# Patient Record
Sex: Male | Born: 1940 | Race: White | Hispanic: No | Marital: Single | Smoking: Former smoker
Health system: Southern US, Community
[De-identification: ages and names within clinical notes are randomized; demographics above are authoritative.]

## PROBLEM LIST (undated history)

## (undated) DIAGNOSIS — G4730 Sleep apnea, unspecified: Secondary | ICD-10-CM

## (undated) DIAGNOSIS — G8929 Other chronic pain: Secondary | ICD-10-CM

## (undated) DIAGNOSIS — E785 Hyperlipidemia, unspecified: Secondary | ICD-10-CM

## (undated) DIAGNOSIS — I10 Essential (primary) hypertension: Secondary | ICD-10-CM

## (undated) DIAGNOSIS — M545 Low back pain, unspecified: Secondary | ICD-10-CM

## (undated) DIAGNOSIS — J45909 Unspecified asthma, uncomplicated: Secondary | ICD-10-CM

## (undated) DIAGNOSIS — E119 Type 2 diabetes mellitus without complications: Secondary | ICD-10-CM

## (undated) HISTORY — PX: SHOULDER SURGERY: SHX246

## (undated) HISTORY — DX: Hyperlipidemia, unspecified: E78.5

## (undated) HISTORY — DX: Low back pain: M54.5

## (undated) HISTORY — DX: Sleep apnea, unspecified: G47.30

## (undated) HISTORY — DX: Type 2 diabetes mellitus without complications: E11.9

## (undated) HISTORY — PX: KNEE SURGERY: SHX244

## (undated) HISTORY — PX: BACK SURGERY: SHX140

## (undated) HISTORY — DX: Unspecified asthma, uncomplicated: J45.909

## (undated) HISTORY — DX: Other chronic pain: G89.29

## (undated) HISTORY — DX: Essential (primary) hypertension: I10

## (undated) HISTORY — DX: Low back pain, unspecified: M54.50

---

## 1997-12-30 ENCOUNTER — Encounter: Payer: Self-pay | Admitting: Emergency Medicine

## 1997-12-30 ENCOUNTER — Emergency Department (HOSPITAL_COMMUNITY): Admission: EM | Admit: 1997-12-30 | Discharge: 1997-12-30 | Payer: Self-pay | Admitting: Emergency Medicine

## 1998-01-05 ENCOUNTER — Emergency Department (HOSPITAL_COMMUNITY): Admission: EM | Admit: 1998-01-05 | Discharge: 1998-01-05 | Payer: Self-pay | Admitting: Emergency Medicine

## 1998-01-05 ENCOUNTER — Encounter: Payer: Self-pay | Admitting: Emergency Medicine

## 1998-01-20 ENCOUNTER — Emergency Department (HOSPITAL_COMMUNITY): Admission: EM | Admit: 1998-01-20 | Discharge: 1998-01-20 | Payer: Self-pay | Admitting: Emergency Medicine

## 2000-07-11 ENCOUNTER — Emergency Department (HOSPITAL_COMMUNITY): Admission: EM | Admit: 2000-07-11 | Discharge: 2000-07-11 | Payer: Self-pay | Admitting: Emergency Medicine

## 2003-04-19 ENCOUNTER — Ambulatory Visit (HOSPITAL_COMMUNITY): Admission: RE | Admit: 2003-04-19 | Discharge: 2003-04-19 | Payer: Self-pay | Admitting: Orthopedic Surgery

## 2003-04-23 ENCOUNTER — Inpatient Hospital Stay (HOSPITAL_COMMUNITY): Admission: RE | Admit: 2003-04-23 | Discharge: 2003-04-24 | Payer: Self-pay | Admitting: Orthopedic Surgery

## 2003-12-17 ENCOUNTER — Emergency Department (HOSPITAL_COMMUNITY): Admission: EM | Admit: 2003-12-17 | Discharge: 2003-12-18 | Payer: Self-pay | Admitting: Emergency Medicine

## 2004-02-05 ENCOUNTER — Ambulatory Visit: Payer: Self-pay | Admitting: Family Medicine

## 2004-02-17 ENCOUNTER — Encounter: Admission: RE | Admit: 2004-02-17 | Discharge: 2004-02-17 | Payer: Self-pay | Admitting: Family Medicine

## 2004-03-12 ENCOUNTER — Ambulatory Visit: Payer: Self-pay | Admitting: Family Medicine

## 2004-05-28 ENCOUNTER — Emergency Department (HOSPITAL_COMMUNITY): Admission: EM | Admit: 2004-05-28 | Discharge: 2004-05-28 | Payer: Self-pay | Admitting: Emergency Medicine

## 2004-06-02 ENCOUNTER — Ambulatory Visit (HOSPITAL_COMMUNITY): Admission: RE | Admit: 2004-06-02 | Discharge: 2004-06-02 | Payer: Self-pay | Admitting: General Surgery

## 2004-08-21 ENCOUNTER — Ambulatory Visit: Payer: Self-pay | Admitting: Internal Medicine

## 2004-09-17 ENCOUNTER — Ambulatory Visit (HOSPITAL_BASED_OUTPATIENT_CLINIC_OR_DEPARTMENT_OTHER): Admission: RE | Admit: 2004-09-17 | Discharge: 2004-09-17 | Payer: Self-pay | Admitting: Internal Medicine

## 2004-09-27 ENCOUNTER — Ambulatory Visit: Payer: Self-pay | Admitting: Internal Medicine

## 2004-10-15 ENCOUNTER — Ambulatory Visit: Payer: Self-pay | Admitting: Internal Medicine

## 2004-11-06 ENCOUNTER — Ambulatory Visit: Payer: Self-pay | Admitting: Internal Medicine

## 2004-12-08 ENCOUNTER — Ambulatory Visit: Payer: Self-pay | Admitting: Internal Medicine

## 2004-12-22 ENCOUNTER — Ambulatory Visit (HOSPITAL_COMMUNITY): Admission: RE | Admit: 2004-12-22 | Discharge: 2004-12-22 | Payer: Self-pay | Admitting: Internal Medicine

## 2005-09-07 ENCOUNTER — Encounter: Admission: RE | Admit: 2005-09-07 | Discharge: 2005-09-07 | Payer: Self-pay | Admitting: Orthopedic Surgery

## 2005-09-28 ENCOUNTER — Encounter: Admission: RE | Admit: 2005-09-28 | Discharge: 2005-09-28 | Payer: Self-pay | Admitting: Orthopedic Surgery

## 2006-02-15 ENCOUNTER — Encounter: Admission: RE | Admit: 2006-02-15 | Discharge: 2006-02-15 | Payer: Self-pay | Admitting: Rheumatology

## 2006-12-07 DIAGNOSIS — E119 Type 2 diabetes mellitus without complications: Secondary | ICD-10-CM | POA: Insufficient documentation

## 2006-12-07 DIAGNOSIS — N400 Enlarged prostate without lower urinary tract symptoms: Secondary | ICD-10-CM

## 2007-11-15 ENCOUNTER — Ambulatory Visit: Payer: Self-pay | Admitting: Gastroenterology

## 2007-11-29 ENCOUNTER — Ambulatory Visit: Payer: Self-pay | Admitting: Gastroenterology

## 2007-11-29 HISTORY — PX: COLONOSCOPY: SHX174

## 2009-06-30 ENCOUNTER — Emergency Department (HOSPITAL_COMMUNITY): Admission: EM | Admit: 2009-06-30 | Discharge: 2009-06-30 | Payer: Self-pay | Admitting: Emergency Medicine

## 2010-05-17 ENCOUNTER — Inpatient Hospital Stay (INDEPENDENT_AMBULATORY_CARE_PROVIDER_SITE_OTHER)
Admission: RE | Admit: 2010-05-17 | Discharge: 2010-05-17 | Disposition: A | Discharge status: Home / Self Care | Payer: Medicare Other | Source: Ambulatory Visit | Attending: Emergency Medicine | Admitting: Emergency Medicine

## 2010-05-17 ENCOUNTER — Ambulatory Visit (INDEPENDENT_AMBULATORY_CARE_PROVIDER_SITE_OTHER): Payer: Medicare Other

## 2010-05-17 DIAGNOSIS — J42 Unspecified chronic bronchitis: Secondary | ICD-10-CM

## 2010-05-23 ENCOUNTER — Inpatient Hospital Stay (INDEPENDENT_AMBULATORY_CARE_PROVIDER_SITE_OTHER)
Admission: RE | Admit: 2010-05-23 | Discharge: 2010-05-23 | Disposition: A | Discharge status: Home / Self Care | Payer: Medicare Other | Source: Ambulatory Visit | Attending: Family Medicine | Admitting: Family Medicine

## 2010-05-23 DIAGNOSIS — J309 Allergic rhinitis, unspecified: Secondary | ICD-10-CM

## 2010-06-02 LAB — POCT I-STAT, CHEM 8
BUN: 12 mg/dL (ref 6–23)
Calcium, Ion: 1.23 mmol/L (ref 1.12–1.32)
Chloride: 101 mEq/L (ref 96–112)
Creatinine, Ser: 0.7 mg/dL (ref 0.4–1.5)
Glucose, Bld: 103 mg/dL — ABNORMAL HIGH (ref 70–99)
HCT: 44 % (ref 39.0–52.0)
Hemoglobin: 15 g/dL (ref 13.0–17.0)
Potassium: 4.3 mEq/L (ref 3.5–5.1)
Sodium: 138 mEq/L (ref 135–145)
TCO2: 30 mmol/L (ref 0–100)

## 2010-06-30 ENCOUNTER — Encounter: Payer: Self-pay | Admitting: Internal Medicine

## 2010-07-02 ENCOUNTER — Telehealth: Payer: Self-pay | Admitting: Internal Medicine

## 2010-07-02 ENCOUNTER — Ambulatory Visit (INDEPENDENT_AMBULATORY_CARE_PROVIDER_SITE_OTHER): Payer: Medicare Other | Admitting: Internal Medicine

## 2010-07-02 ENCOUNTER — Encounter: Payer: Self-pay | Admitting: Internal Medicine

## 2010-07-02 VITALS — BP 128/74 | HR 77 | Ht 73.0 in | Wt 243.8 lb

## 2010-07-02 DIAGNOSIS — G4733 Obstructive sleep apnea (adult) (pediatric): Secondary | ICD-10-CM

## 2010-07-02 DIAGNOSIS — J310 Chronic rhinitis: Secondary | ICD-10-CM

## 2010-07-02 DIAGNOSIS — J40 Bronchitis, not specified as acute or chronic: Secondary | ICD-10-CM

## 2010-07-02 MED ORDER — AMOXICILLIN-POT CLAVULANATE 875-125 MG PO TABS: 1.0000 | ORAL_TABLET | Freq: Two times a day (BID) | ORAL | Status: AC

## 2010-07-02 MED ORDER — OLOPATADINE HCL 0.2 % OP SOLN: 1.0000 [drp] | Freq: Every day | OPHTHALMIC | Status: DC

## 2010-07-02 NOTE — Patient Instructions (Addendum)
Neb alb   Depo 80  Script sent for augmentin antibiotic   We will schedule return for allergy skin testing. You will need to be off all antihistamines, cold and allergy medicines, over the counter sleep medicines and cough syrups for 3 days before the skin tests.

## 2010-07-02 NOTE — Telephone Encounter (Signed)
Spoke with pharmacist and he states that since the original msg, they received the rx from [place] by fax and nothing further is needed.

## 2010-07-02 NOTE — Progress Notes (Signed)
  Subjective:    Patient ID: Cole Morgan, male    DOB: 10/30/1940, 71 y.o.   MRN: [id number]  HPI 70 yo former smoker with hx of allergic rhinitis, skin test positive. Hx obstructive sleep apnea (AHI 15.4/hr)  intolerant of CPAP. Hx of multiple traumas, mostly from horse-riding injuries. I last saw him 2006. He wants reassessment of his allergy status. Blames allergy for making him sleepy. Describes bronchitis each winter, which he attributes to allergy rather than colds- with productive cough, head and chest congestion. Went to [facility] twice this winter with nasal and chest congestion treated with antibiotics, prednisone taper, nose spray. He has been much better in the past month. Today he notes productive cough, white phlegm. He expects to treat this as an acute event, after which he expects to stay well through the summer.Denies fever, purulent mucus, blood, nodes, sneeze.   Review of Systems See HPI Constitutional:   No weight loss, night sweats,  Fevers, chills, fatigue, lassitude. HEENT:   No headaches,  Difficulty swallowing,  Tooth/dental problems,  Sore throat,   CV:  No chest pain,  Orthopnea, PND, swelling in lower extremities, anasarca, dizziness, palpitations  GI  No heartburn, indigestion, abdominal pain, nausea, vomiting, diarrhea, change in bowel habits, loss of appetite  Resp: No shortness of breath with exertion or at rest.  No excess mucus, no productive cough,  No non-productive cough,  No coughing up of blood.  No change in color of mucus.  No wheezing.   Skin: no rash or lesions.  GU: no dysuria, change in color of urine, no urgency or frequency.  No flank pain.  MS:  Chronic shoulder pain and stiffness with decreased range of motion.  Psych:  No change in mood or affect. No depression or anxiety.  No memory loss.      Objective:   Physical Exam General- Alert, Oriented, Affect-appropriate, Distress- none acute     Tall/ big man  Skin- rash-none, lesions-  none, excoriation- none  Lymphadenopathy- none  Head- atraumatic  Eyes- Gross vision intact, PERRLA, conjunctivae clear, secretions  Ears- Normal- Hearing, canals, Tm L ,   R ,  Nose- Clear, o- Septal dev, mucus, polyps, erosion, perforation   Throat- Mallampati II , mucosa clear , drainage- none, tonsils- atrophic  Neck- flexible , trachea midline, no stridor , thyroid nl, carotid no bruit  Chest - symmetrical excursion , unlabored     Heart/CV- RRR , no murmur , no gallop  , no rub, nl s1 s2                     - JVD- none , edema- none, stasis changes- none, varices- none     Lung- clear to P&A, wheeze- none, cough- none , dullness-none, rub- none     Chest wall- Abd- tender-no, distended-no, bowel sounds-present, HSM- no  Br/ Gen/ Rectal- Not done, not indicated  Extrem- cyanosis- none, clubbing, none, atrophy- none, strength- nl. Moves upper arms stiffly  Neuro- grossly intact to observation         Assessment & Plan:

## 2010-07-05 ENCOUNTER — Encounter: Payer: Self-pay | Admitting: Internal Medicine

## 2010-07-05 DIAGNOSIS — J302 Other seasonal allergic rhinitis: Secondary | ICD-10-CM | POA: Insufficient documentation

## 2010-07-05 DIAGNOSIS — G4733 Obstructive sleep apnea (adult) (pediatric): Secondary | ICD-10-CM | POA: Insufficient documentation

## 2010-07-05 NOTE — Assessment & Plan Note (Addendum)
Perennial rhinitis probably with some seasonal allergic rhinitis and very likly some mechanical narrowing from old trauma. During the winter, he gets episodes of URI w/ bronchitis, although he tends to regard them as allergy. He specifically wants to reassess the allergy aspect.

## 2010-07-07 ENCOUNTER — Encounter: Payer: Self-pay | Admitting: Internal Medicine

## 2010-07-08 ENCOUNTER — Encounter: Payer: Self-pay | Admitting: Internal Medicine

## 2010-07-31 NOTE — Procedures (Signed)
Cole Morgan, Cole Morgan              ACCOUNT NO.:  [id number]   MEDICAL RECORD NO.:  [id number]          PATIENT TYPE:  OUT   LOCATION:  SLEEP CENTER                 FACILITY:  [facility]   PHYSICIAN:  Clinton D. Maple Hudson, M.D. DATE OF BIRTH:  April 13, 1940   DATE OF STUDY:  09/17/2004                              NOCTURNAL POLYSOMNOGRAM   REFERRING PHYSICIAN:  Jetty Duhamel, MD   INDICATION FOR STUDY:  Hypersomnia with sleep apnea. Epworth sleep score:  24. BMI 31. Weight 238 pounds.   SLEEP ARCHITECTURE:  Total sleep time 393 minutes with sleep efficiency 81%.  Stage I 10%, stage II 49%, stage III and IV 10%, REM 11% of total sleep  time. Sleep latency 3.5 minutes. REM latency 101 minutes. Awake after sleep  onset 92 minutes. Arousal index 32. No bed time medication reported.   RESPIRATORY DATA:  Respiratory disturbance index (RDI/AHI) 15.4 obstructive  events per hour indicating moderate obstructive sleep apnea/hypopnea  syndrome. There were 22 obstructive apneas, 1 mixed apnea, and 70 hypopneas.  Events were not positional but were rather more frequent while supine. The  technician could not fit him with a comfortable mask. He complained of  marked nasal congestion but could not sleep with a full face mask on this  study night without medication and he complained of his face itching. This  study was completed as a diagnostic NPSG.   OXYGEN DATA:  Moderate snoring with oxygen desaturation with a nadir of 75%.  Mean oxygen saturation through the study was 95% on room air.   CARDIAC DATA:  Normal sinus rhythm with occasional PVC.   MOVEMENT/PARASOMNIA:  A total of 187 limb jerks were recorded of which 54  were associated with arousal or awakening for a periodic limb movement with  arousal index of 8.2 per hour which is increased.   IMPRESSION:  1.  Moderate obstructive sleep apnea/hypopnea syndrome, respiratory      disturbance index 15.4 per hour with moderate snoring and oxygen  desaturation of 75%.  2.  Consider return for C-PAP titration with a sedative suggested. May also      benefit from correction with nasal      congestion.  3.  Periodic limb movement with arousal, 8.2 per hour.      Clinton D. Maple Hudson, M.D.  Diplomat    CDY/MEDQ  D:  09/27/2004 13:12:15  T:  09/28/2004 00:14:54  Job:  [id number]

## 2010-07-31 NOTE — Op Note (Signed)
NAME:  Cole Morgan, Cole Morgan                        ACCOUNT NO.:  [id number]   MEDICAL RECORD NO.:  [id number]                   PATIENT TYPE:  OIB   LOCATION:  6702                                 FACILITY:  [facility]   PHYSICIAN:  Burnard Bunting, M.D.                 DATE OF BIRTH:  [birth year]   DATE OF PROCEDURE:  04/22/2003  DATE OF DISCHARGE:                                 OPERATIVE REPORT   PREOPERATIVE DIAGNOSIS:  Right shoulder impingement syndrome and rotator  cuff tear.   POSTOPERATIVE DIAGNOSIS:  Right shoulder impingement syndrome and rotator  cuff tear.   OPERATION PERFORMED:  Right shoulder open rotator cuff repair, subacromial  decompression, debridement.   SURGEON:  Jerolyn Shin. Tresa Res, M.D.   ASSISTANT:  Burnard Bunting, M.D.   ANESTHESIA:  General endotracheal plus interscalene block.   ESTIMATED BLOOD LOSS:  50 mL.   DRAINS:  None.   OPERATIVE FINDINGS:  1. Examination under anesthesia, range of motion external rotation 15     degrees, abduction 70.  External rotation 90 degrees abduction 90,     internal rotation 90 degrees abduction, 60.  Forward flexion 180.     Shoulder stability 1+ anterior posterior less than 1 cm .  2. Tear of the infraspinatus and supraspinatus with minimal retraction but     the large full thickness tear was present with significant bursitis     noted.   DESCRIPTION OF PROCEDURE:  The patient was brought to the operating room  where general endotracheal anesthesia was induced.  Preoperative intravenous  antibiotics were administered.  The right shoulder was prepped with DuraPrep  solution and draped with DuraPrep solution and draped in sterile manner.  The prior incision was utilized. This strap incision was incised from  posterior to anterior.  Skin and subcutaneous tissue were sharply divided.  Full thickness skin flaps were developed on the superior and inferior  portion of the incision.  The deltoid was identified.  The deltoid was  split  down a measured distance of 4 cm with stay suture placed at the inferior  aspect of the deltoid split.  The deltoid was retracted with self-retaining  Arthrex retractor.  The bursa was removed.  Thick bursa was present.  Subacromial decompression was then performed with a rasp.  The rotator cuff  tear was identified.  It involved almost all of the infraspinatus and  supraspinatus.  Using a headlight and the arthroscope placed through the  incision, the biceps tendon was found to be intact.  Debridement was then  performed.  The rotator cuff tear was mobilized.  Its edges were sharply  debrided back to healthy  Viable tissue.  __________  bursitis was removed  from the rotator cuff itself.  At this time, five #2 FiberWire sutures were  placed in modified ONEOK fashion through the rotator cuff.  CurvTek  drill was then used to drill  a bone tunnels through the humeral head.  The  drill holes were placed in such manner that reproduced the more anterior to  posterior __________  of the minimally retracted supraspinatus and  infraspinatus tendon.  Once suture anchor was placed at the posterior aspect  of the rotator cuff tendon repair.  Using a combination of the suture, a  second suture anchor was placed at the anterior aspect of the rotator cuff  tendon tear.  Using combination of suture anchors and bone tunnels, the  large rotator cuff tear was repaired back down in watertight fashion.  The  arm was taken through a range of motion and found to have excellent motion  with minimal tension on the repair. At this time the incision was thoroughly  irrigated. The deltoid split was closed using a #1 Vicryl suture.  Skin was  closed using interrupted inverted 2-0 Vicryl suture and simple nylon suture  The patient tolerated the procedure well without immediate complications.                                               Burnard Bunting, M.D.    GSD/MEDQ  D:  04/23/2003  T:  04/23/2003   Job:  [id number]

## 2010-08-19 ENCOUNTER — Ambulatory Visit: Payer: Medicare Other | Admitting: Internal Medicine

## 2010-09-18 ENCOUNTER — Other Ambulatory Visit: Payer: Self-pay | Admitting: Sports Medicine

## 2010-09-18 DIAGNOSIS — M25511 Pain in right shoulder: Secondary | ICD-10-CM

## 2010-09-22 ENCOUNTER — Ambulatory Visit
Admission: RE | Admit: 2010-09-22 | Discharge: 2010-09-22 | Disposition: A | Discharge status: Home / Self Care | Payer: Medicare Other | Source: Ambulatory Visit | Attending: Sports Medicine | Admitting: Sports Medicine

## 2010-09-22 DIAGNOSIS — M25511 Pain in right shoulder: Secondary | ICD-10-CM

## 2010-09-23 ENCOUNTER — Other Ambulatory Visit: Payer: Self-pay | Admitting: Sports Medicine

## 2010-09-23 ENCOUNTER — Ambulatory Visit: Payer: Medicare Other | Admitting: Internal Medicine

## 2010-09-23 DIAGNOSIS — M25511 Pain in right shoulder: Secondary | ICD-10-CM

## 2010-09-28 ENCOUNTER — Ambulatory Visit
Admission: RE | Admit: 2010-09-28 | Discharge: 2010-09-28 | Disposition: A | Discharge status: Home / Self Care | Payer: Medicare Other | Source: Ambulatory Visit | Attending: Sports Medicine | Admitting: Sports Medicine

## 2010-09-28 DIAGNOSIS — M25511 Pain in right shoulder: Secondary | ICD-10-CM

## 2010-12-02 ENCOUNTER — Inpatient Hospital Stay (INDEPENDENT_AMBULATORY_CARE_PROVIDER_SITE_OTHER)
Admission: RE | Admit: 2010-12-02 | Discharge: 2010-12-02 | Disposition: A | Discharge status: Home / Self Care | Payer: Medicare Other | Source: Ambulatory Visit | Attending: Family Medicine | Admitting: Family Medicine

## 2010-12-02 DIAGNOSIS — S91309A Unspecified open wound, unspecified foot, initial encounter: Secondary | ICD-10-CM

## 2010-12-14 LAB — GLUCOSE, CAPILLARY
Glucose-Capillary: 103 — ABNORMAL HIGH
Glucose-Capillary: 113 — ABNORMAL HIGH

## 2011-01-29 ENCOUNTER — Telehealth: Payer: Self-pay | Admitting: Family Medicine

## 2011-01-29 NOTE — Telephone Encounter (Signed)
Spoke to Dr. Clent Ridges, and he agreed to see the patient as new pt.  He should not start seeing him with an acute visit.  Pt. Notified and agrees. He will go to Urgent Care if he becomes sick before he schedules the appt here.

## 2011-01-29 NOTE — Telephone Encounter (Signed)
Pt has not seen doc fry since 2008. Pt is on medicare having cough and sob. Pt is aware doc is not accepting new medicare patients. Pt is requesting ov today. This call was triaged by deb

## 2011-01-29 NOTE — Telephone Encounter (Signed)
Triage was asked to talk to this pt to see if his symptoms sound emergent.  Pt states he has been bothered with some transient SOB for several years.  After a discussion with Pt, he admits he really does not have a family doctor right now, but his girlfriend sees Dr. Clent Ridges.  When he came with her to see Dr. Clent Ridges, he spoke to the front office about establishing as a new pt.  He claims that he was told to fill out some papers, and Dr. Clent Ridges could see him when he needed to be seen.  Referred the call back upfront to speak to the office staff as I do not schedule new pts, and am not aware of the guidelines with each MD's protocol and Medicare rules.

## 2011-02-01 NOTE — Telephone Encounter (Signed)
Pt is sch for 03-31-2011 130pm. Pt will callback to see if any cancellations.

## 2011-02-03 ENCOUNTER — Emergency Department (INDEPENDENT_AMBULATORY_CARE_PROVIDER_SITE_OTHER): Payer: Medicare Other

## 2011-02-03 ENCOUNTER — Encounter (HOSPITAL_COMMUNITY): Payer: Self-pay | Admitting: *Deleted

## 2011-02-03 ENCOUNTER — Emergency Department (INDEPENDENT_AMBULATORY_CARE_PROVIDER_SITE_OTHER)
Admission: EM | Admit: 2011-02-03 | Discharge: 2011-02-03 | Disposition: A | Discharge status: Home / Self Care | Payer: Medicare Other | Source: Home / Self Care | Attending: Emergency Medicine | Admitting: Emergency Medicine

## 2011-02-03 DIAGNOSIS — R059 Cough, unspecified: Secondary | ICD-10-CM

## 2011-02-03 DIAGNOSIS — R05 Cough: Secondary | ICD-10-CM

## 2011-02-03 MED ORDER — HYDROCOD POLST-CHLORPHEN POLST 10-8 MG/5ML PO LQCR: 5.0000 mL | Freq: Two times a day (BID) | ORAL | Status: DC | PRN

## 2011-02-03 NOTE — ED Provider Notes (Signed)
History     CSN: [id number] Arrival date & time: 02/03/2011  6:28 PM   First MD Initiated Contact with Patient 02/03/11 1809      Chief Complaint  Patient presents with  . Cough    (Consider location/radiation/quality/duration/timing/severity/associated sxs/prior treatment) HPI Comments: For about 6 weeks been coughing its worse at night and during the winter" actually it also tends to happen in Feb" " i never went back to get the allergy testing that I wanted and discussed with Dr.Young". No fevers, at times do feel like i cant breath well".  Patient is a 70 y.o. male presenting with cough. The history is provided by the patient.  Cough This is a recurrent problem. Episode onset: 6 weeks. The problem occurs constantly. The cough is non-productive. There has been no fever. Associated symptoms include shortness of breath. Pertinent negatives include no weight loss, no sore throat and no wheezing. He has tried nothing for the symptoms.    Past Medical History  Diagnosis Date  . Type II or unspecified type diabetes mellitus without mention of complication, not stated as uncontrolled   . Sleep apnea   . ALLERGIC RHINITIS     Past Surgical History  Procedure Date  . Back surgery   . Knee surgery   . Shoulder surgery     multiple sugeries, injured riding horses    Family History  Problem Relation Age of Onset  . Coronary artery disease    . Diabetes    . Asthma    . Allergies      History  Substance Use Topics  . Smoking status: Former Smoker -- 1.0 packs/day for 15 years    Types: Cigarettes    Quit date: 03/15/1985  . Smokeless tobacco: Not on file  . Alcohol Use: No      Review of Systems  Constitutional: Negative for weight loss.  HENT: Negative for sore throat.   Respiratory: Positive for cough and shortness of breath. Negative for wheezing.     Allergies  Review of patient's allergies indicates no known allergies.  Home Medications   Current  Outpatient Rx  Name Route Sig Dispense Refill  . AMOXICILLIN-POT CLAVULANATE 875-125 MG PO TABS Oral Take 1 tablet by mouth 2 (two) times daily.      Marland Kitchen PREDNISONE 10 MG PO TABS Oral Take 10 mg by mouth daily.      . FOSINOPRIL SODIUM 10 MG PO TABS Oral Take 10 mg by mouth daily.      Marland Kitchen GLIMEPIRIDE 1 MG PO TABS Oral Take 1 mg by mouth daily before breakfast.      . OLOPATADINE HCL 0.2 % OP SOLN Ophthalmic Apply 1 drop to eye daily. 1 Bottle 5  . PIOGLITAZONE HCL-METFORMIN HCL 15-850 MG PO TABS Oral Take 1 tablet by mouth daily.      Marland Kitchen PRAVASTATIN SODIUM 40 MG PO TABS Oral Take 40 mg by mouth daily.      Marland Kitchen SITAGLIPTIN PHOSPHATE 100 MG PO TABS Oral Take 100 mg by mouth daily.        BP 127/81  Pulse 80  Temp(Src) 98.1 F (36.7 C) (Oral)  Resp 20  SpO2 94%  Physical Exam  ED Course  Procedures (including critical care time)  Labs Reviewed - No data to display No results found.   No diagnosis found.    MDM  Cough for 6 weeks and dyspnea- with PCP and previous care by Pulmonologist  Jimmie Molly, MD 02/03/11 2012

## 2011-02-03 NOTE — ED Notes (Signed)
Pt   Has  Cough  Shortness   Of  Breath       X     6  Weeks       Worse  At  Night        The  Cough   Is  Worse  At  Night       The  Mucous    Is  Thick

## 2011-02-09 ENCOUNTER — Ambulatory Visit (INDEPENDENT_AMBULATORY_CARE_PROVIDER_SITE_OTHER): Payer: Medicare Other | Admitting: Internal Medicine

## 2011-02-09 ENCOUNTER — Encounter: Payer: Self-pay | Admitting: Internal Medicine

## 2011-02-09 DIAGNOSIS — J209 Acute bronchitis, unspecified: Secondary | ICD-10-CM

## 2011-02-09 DIAGNOSIS — J310 Chronic rhinitis: Secondary | ICD-10-CM

## 2011-02-09 NOTE — Progress Notes (Signed)
Patient ID: Cole Morgan, male    DOB: Sep 23, 1940, 70 y.o.   MRN: [id number]  HPI 70 yo former smoker with hx of allergic rhinitis, skin test positive. Hx obstructive sleep apnea (AHI 15.4/hr)  intolerant of CPAP. Hx of multiple traumas, mostly from horse-riding injuries. I last saw him 2006. He wants reassessment of his allergy status. Blames allergy for making him sleepy. Describes bronchitis each winter, which he attributes to allergy rather than colds- with productive cough, head and chest congestion. Went to [employer] [facility] twice this winter with nasal and chest congestion treated with antibiotics, prednisone taper, nose spray. He has been much better in the past month. Today he notes productive cough, white phlegm. He expects to treat this as an acute event, after which he expects to stay well through the summer. Denies fever, purulent mucus, blood, nodes, sneeze.  02/09/11- 41 yo former smoker with hx of allergic rhinitis, skin test positive. Hx obstructive sleep apnea (AHI 15.4/hr)  intolerant of CPAP. Hx of multiple traumas, mostly from horse-riding injuries. He has had flu vaccine and shingles vaccine. He says because of a death in the family, he forgot he had scheduled allergy skin testing here. Now for the past 8 weeks he has had a persistent bronchitis with cough. He went to an urgent care last week for hard cough with thick sticky mucus. There has been no blood or purulent discharge, fever or adenopathy. They gave him an antibiotic, prednisone taper, Mucinex DM cough syrup. He says each year when he first starts at home, he begins having more of this trouble. We discussed dryness, seasonal colds, house dust. Chest x-ray had not shown acute process. Metered inhaler has been no help. He has a home nebulizer machine but has not been using it.  Review of Systems See HPI Constitutional:   No-   weight loss, night sweats, fevers, chills, fatigue, lassitude. HEENT:   No-  headaches, difficulty  swallowing, tooth/dental problems, sore throat,       No-  sneezing, itching, ear ache, nasal congestion, post nasal drip,  CV:  No-   chest pain, orthopnea, PND, swelling in lower extremities, anasarca,                                  dizziness, palpitations Resp: No-   shortness of breath with exertion or at rest.              + productive cough,  No non-productive cough,  No- coughing up of blood.              No-   change in color of mucus.  No- wheezing.   Skin: No-   rash or lesions. GI:  No-   heartburn, indigestion, abdominal pain, nausea, vomiting, diarrhea,                 change in bowel habits, loss of appetite GU: No-   dysuria, change in color of urine, no urgency or frequency.  No- flank pain. MS:  No-   joint pain or swelling.  No- decreased range of motion.  No- back pain. Neuro-     nothing unusual Psych:  No- change in mood or affect. No depression or anxiety.  No memory loss.  Objective:   Physical Exam General- Alert, Oriented, Affect-appropriate, Distress- none acute Skin- rash-none, lesions- none, excoriation- none Lymphadenopathy- none Head- atraumatic  Eyes- Gross vision intact, PERRLA, conjunctivae clear secretions            Ears- Hearing, canals-normal            Nose- old nasal fracture with septal deviation,, mucus, No-polyps, erosion, perforation             Throat- Mallampati II , mucosa clear , drainage- none, tonsils- atrophic Neck- flexible , trachea midline, no stridor , thyroid nl, carotid no bruit Chest - symmetrical excursion , unlabored           Heart/CV- RRR , no murmur , no gallop  , no rub, nl s1 s2                           - JVD- none , edema- none, stasis changes- none, varices- none           Lung- scattered rhonchi, wheeze- none, +dry cough , dullness-none, rub- none           Chest wall-  Abd- tender-no, distended-no, bowel sounds-present, HSM- no Br/ Gen/ Rectal- Not done, not indicated Extrem- cyanosis- none, clubbing,  none, atrophy- none, strength- nl Neuro- grossly intact to observation

## 2011-02-09 NOTE — Patient Instructions (Signed)
Sample Qvar 80 inhaler-  2 puffs and rinse mouth well, twice daily. Use up the sample.  Depo 80    This will raise your blood sugar  Use your nebulizer up to 4 times daily as needed- it may begin to loosen your chest better  Schedule allergy skin testing- antihistamines block the skin tests, so for 3 days before the test, don't use any antihistamines, otc cough, cold or allergy meds, or your cough syrup, or otc sleep meds.

## 2011-02-10 DIAGNOSIS — J209 Acute bronchitis, unspecified: Secondary | ICD-10-CM | POA: Insufficient documentation

## 2011-02-10 NOTE — Assessment & Plan Note (Addendum)
He has not cleared adequately with antibiotics prednisone and Mucinex D. We discussed effect of steroids on his diabetes. Plan steroid inhaler, use home nebulizer up to 4 times a day if needed, Depo-Medrol.

## 2011-02-10 NOTE — Assessment & Plan Note (Signed)
He still suspects there is a seasonal allergy component to his rhinitis and bronchitis. He wants allergy skin testing done. We discussed this again.

## 2011-02-22 ENCOUNTER — Ambulatory Visit: Payer: Medicare Other | Admitting: Internal Medicine

## 2011-02-22 ENCOUNTER — Encounter: Payer: Self-pay | Admitting: Gastroenterology

## 2011-03-17 ENCOUNTER — Ambulatory Visit (INDEPENDENT_AMBULATORY_CARE_PROVIDER_SITE_OTHER): Payer: Medicare Other | Admitting: Internal Medicine

## 2011-03-17 ENCOUNTER — Encounter: Payer: Self-pay | Admitting: Internal Medicine

## 2011-03-17 VITALS — BP 122/68 | HR 75 | Ht 73.0 in | Wt 237.0 lb

## 2011-03-17 DIAGNOSIS — J3089 Other allergic rhinitis: Secondary | ICD-10-CM

## 2011-03-17 DIAGNOSIS — J209 Acute bronchitis, unspecified: Secondary | ICD-10-CM

## 2011-03-17 NOTE — Patient Instructions (Addendum)
I will have the allergy lab call you when your allergy vaccine is ready to start.  Ok to take your regular medicines as needed    Sample Pataday allergy eye drops-- 1 or 2 drops each eye up to twice daily as needed  Neb neo nasal  Depo 80

## 2011-03-17 NOTE — Progress Notes (Signed)
Patient ID: Cole Morgan, male    DOB: 25-Dec-1940, 71 y.o.   MRN: [id number]  HPI 71 yo former smoker with hx of allergic rhinitis, skin test positive. Hx obstructive sleep apnea (AHI 15.4/hr)  intolerant of CPAP. Hx of multiple traumas, mostly from horse-riding injuries. I last saw him 2006. He wants reassessment of his allergy status. Blames allergy for making him sleepy. Describes bronchitis each winter, which he attributes to allergy rather than colds- with productive cough, head and chest congestion. Went to [facility] ER twice this winter with nasal and chest congestion treated with antibiotics, prednisone taper, nose spray. He has been much better in the past month. Today he notes productive cough, white phlegm. He expects to treat this as an acute event, after which he expects to stay well through the summer. Denies fever, purulent mucus, blood, nodes, sneeze.  03/17/11- 71 yo former smoker with hx of allergic rhinitis, allergic conjunctivitis, skin test positive, recurrent bronchitis. Hx obstructive sleep apnea (AHI 15.4/hr)  intolerant of CPAP. Hx of multiple traumas, mostly from horse-riding injuries. He wants reassessment of his allergy status. Complains of burning itch in throat when he tries to sleep, burning, itching and watering of eyes and nose on waking in AM. Says he has tried multiple otc allergy meds and eye drops w/o benefit.  Allergy Testing-especially positive for house dust but also for numerous grass, weed and tree pollens.  Review of Systems See HPI Constitutional:   No-   weight loss, night sweats, fevers, chills, fatigue, lassitude. HEENT:   No-  headaches, difficulty swallowing, tooth/dental problems, sore throat,       +  sneezing, itching, ear ache, nasal congestion, post nasal drip,  CV:  No-   chest pain, orthopnea, PND, swelling in lower extremities, anasarca, dizziness, palpitations Resp: No- acute  shortness of breath with exertion or at rest.              No-    productive cough,  No non-productive cough,  No- coughing up of blood.              No-   change in color of mucus.  No- wheezing.   Skin: No-   rash or lesions. GI:  No-   heartburn, indigestion, abdominal pain, nausea, vomiting, diarrhea,                 change in bowel habits, loss of appetite GU: MS:  No-   joint pain or swelling.  No- decreased range of motion.  No- back pain. Neuro-     nothing unusual Psych:  No- change in mood or affect. No depression or anxiety.  No memory loss.   Objective:   Physical Exam General- Alert, Oriented, Affect-appropriate, Distress- none acute Skin- rash-none, lesions- none, excoriation- none Lymphadenopathy- none Head- atraumatic            Eyes- Gross vision intact, PERRLA, conjunctivae red            Ears- Hearing, canals-normal            Nose- turbinate edema. He was blowing his nose repeatedly, no-Septal dev, mucus, polyps, erosion, perforation             Throat- Mallampati II , mucosa clear , drainage- none, tonsils- atrophic Neck- flexible , trachea midline, no stridor , thyroid nl, carotid no bruit Chest - symmetrical excursion , unlabored           Heart/CV- RRR , no  murmur , no gallop  , no rub, nl s1 s2                           - JVD- none , edema- none, stasis changes- none, varices- none           Lung- clear to P&A, wheeze- none, cough- none , dullness-none, rub- none           Chest wall- surgical scars Abd-  Br/ Gen/ Rectal- Not done, not indicated Extrem- cyanosis- none, clubbing, none, atrophy- none, strength- nl Neuro- grossly intact to observation

## 2011-03-17 NOTE — Assessment & Plan Note (Signed)
He has gone as far as he thinks he can with oral antihistamines and various kinds of nasal sprays. We discussed his purchase of an air cleaner which has made only a little help, and other measures he has attempted for dust control. We discussed allergy vaccine, technique, goals, potentially significant risks including anaphylaxis and potential lack of benefit. He wishes to start allergy shots.

## 2011-03-18 ENCOUNTER — Ambulatory Visit (INDEPENDENT_AMBULATORY_CARE_PROVIDER_SITE_OTHER): Payer: Medicare Other

## 2011-03-18 DIAGNOSIS — J309 Allergic rhinitis, unspecified: Secondary | ICD-10-CM

## 2011-03-19 ENCOUNTER — Telehealth: Payer: Self-pay | Admitting: *Deleted

## 2011-03-19 NOTE — Telephone Encounter (Signed)
Called pt.Marland Kitchen He is going to start his vac. 03-22-2011. Pt. lives in [place], is there an office closer to him that will be willing to give him his allergy shots. I'm asking because I have a feeling he's going to ask after a time or two of driving over here.Please advise.

## 2011-03-22 ENCOUNTER — Telehealth: Payer: Self-pay | Admitting: Internal Medicine

## 2011-03-22 ENCOUNTER — Ambulatory Visit (INDEPENDENT_AMBULATORY_CARE_PROVIDER_SITE_OTHER): Payer: Medicare Other

## 2011-03-22 DIAGNOSIS — J309 Allergic rhinitis, unspecified: Secondary | ICD-10-CM

## 2011-03-22 NOTE — Telephone Encounter (Signed)
Sample at front for pick up-Left message that sample is ready.

## 2011-03-22 NOTE — Telephone Encounter (Signed)
Ok to leave sample Patanase nasal spray  1-2 puffs each nostril up to twice daily as needed

## 2011-03-22 NOTE — Telephone Encounter (Signed)
I think his primary doctor is Dr Clent Ridges. That office, and most offices, wil not give allergy shots. If he is going to get shots he will need to come here.

## 2011-03-22 NOTE — Telephone Encounter (Signed)
Pt was not in lobby when checked, also asked Krista L and she advised pt is not there. [email or web address] x 1

## 2011-03-22 NOTE — Telephone Encounter (Signed)
Pt would like a sample of a nasal spray to help with congestion, something he can use as needed-pls advise and if ok he would like sample left out front to p/u on Thursday when he comes for his allergy injection.

## 2011-03-25 ENCOUNTER — Ambulatory Visit (INDEPENDENT_AMBULATORY_CARE_PROVIDER_SITE_OTHER): Payer: Medicare Other

## 2011-03-25 DIAGNOSIS — J309 Allergic rhinitis, unspecified: Secondary | ICD-10-CM

## 2011-03-29 ENCOUNTER — Ambulatory Visit (INDEPENDENT_AMBULATORY_CARE_PROVIDER_SITE_OTHER): Payer: Medicare Other

## 2011-03-29 ENCOUNTER — Encounter: Payer: Self-pay | Admitting: Internal Medicine

## 2011-03-29 ENCOUNTER — Telehealth: Payer: Self-pay | Admitting: Internal Medicine

## 2011-03-29 DIAGNOSIS — J309 Allergic rhinitis, unspecified: Secondary | ICD-10-CM

## 2011-03-29 MED ORDER — PROMETHAZINE-CODEINE 6.25-10 MG/5ML PO SYRP: 5.0000 mL | ORAL_SOLUTION | Freq: Four times a day (QID) | ORAL | Status: AC | PRN

## 2011-03-29 NOTE — Telephone Encounter (Signed)
Per CDY, give pt sample of Qvar , 2 puffs BID and call in phenergan w/ codeine, 200 mL, 1 tsp every 6 hours PRN cough. Pt has sample and will call to let CDY know if this helps. Will also call if his cough does not improve or if sxs get worse. Cough med called to [facility] per pt request.

## 2011-03-29 NOTE — Telephone Encounter (Signed)
Patient in lobby.  Patient asking for inhaler sample or rx for cough.

## 2011-03-29 NOTE — Telephone Encounter (Signed)
Pt c/o thick white mucus and says he coughed all night and cannot sleep. He is requesting something be called in for the cough. Pt on his way to the office for his allergy injection and will check on the msg when he arrives. Pls advise.No Known Allergies

## 2011-03-31 ENCOUNTER — Encounter: Payer: Self-pay | Admitting: Family Medicine

## 2011-03-31 ENCOUNTER — Ambulatory Visit (INDEPENDENT_AMBULATORY_CARE_PROVIDER_SITE_OTHER): Payer: Medicare Other | Admitting: Family Medicine

## 2011-03-31 VITALS — BP 110/60 | HR 73 | Temp 98.1°F | Ht 73.5 in | Wt 230.0 lb

## 2011-03-31 DIAGNOSIS — I10 Essential (primary) hypertension: Secondary | ICD-10-CM

## 2011-03-31 DIAGNOSIS — E785 Hyperlipidemia, unspecified: Secondary | ICD-10-CM

## 2011-03-31 DIAGNOSIS — N401 Enlarged prostate with lower urinary tract symptoms: Secondary | ICD-10-CM

## 2011-03-31 DIAGNOSIS — Z136 Encounter for screening for cardiovascular disorders: Secondary | ICD-10-CM

## 2011-03-31 DIAGNOSIS — N138 Other obstructive and reflux uropathy: Secondary | ICD-10-CM

## 2011-03-31 DIAGNOSIS — K635 Polyp of colon: Secondary | ICD-10-CM

## 2011-03-31 DIAGNOSIS — E119 Type 2 diabetes mellitus without complications: Secondary | ICD-10-CM

## 2011-03-31 MED ORDER — HYDROCOD POLST-CHLORPHEN POLST 10-8 MG/5ML PO LQCR
5.0000 mL | Freq: Two times a day (BID) | ORAL | Status: DC | PRN
Start: 2011-03-31 — End: 2011-05-17

## 2011-03-31 NOTE — Progress Notes (Signed)
  Subjective:    Patient ID: Cole Morgan, male    DOB: 11/04/1940, 71 y.o.   MRN: [id number]  HPI 71 yr old male to re-establish with me after an absence of about 4 years. He has been seeing Dr. Lucianne Muss regularly to manage his diabetes, lipids, and HTN. He sees Dr. Maple Hudson regularly for his allergies. He feels well in general except for frequent dry coughing. He recently started on allergy shots, and we hope these will give him some relief. No chest pain or SOB. He stays quite active caring for and riding his horses.    Review of Systems  Constitutional: Negative.   HENT: Negative.   Eyes: Negative.   Respiratory: Negative.   Cardiovascular: Negative.   Gastrointestinal: Negative.   Genitourinary: Negative.   Musculoskeletal: Negative.   Skin: Negative.   Neurological: Negative.   Hematological: Negative.   Psychiatric/Behavioral: Negative.        Objective:   Physical Exam  Constitutional: He is oriented to person, place, and time. He appears well-developed and well-nourished. No distress.  HENT:  Head: Normocephalic and atraumatic.  Right Ear: External ear normal.  Left Ear: External ear normal.  Nose: Nose normal.  Mouth/Throat: Oropharynx is clear and moist. No oropharyngeal exudate.  Eyes: Conjunctivae and EOM are normal. Pupils are equal, round, and reactive to light. Right eye exhibits no discharge. Left eye exhibits no discharge. No scleral icterus.  Neck: Neck supple. No JVD present. No tracheal deviation present. No thyromegaly present.  Cardiovascular: Normal rate, regular rhythm, normal heart sounds and intact distal pulses.  Exam reveals no gallop and no friction rub.   No murmur heard.      EKG normal with a single PVC  Pulmonary/Chest: Effort normal and breath sounds normal. No respiratory distress. He has no wheezes. He has no rales. He exhibits no tenderness.  Abdominal: Soft. Bowel sounds are normal. He exhibits no distension and no mass. There is no  tenderness. There is no rebound and no guarding.  Genitourinary: Rectum normal, prostate normal and penis normal. Guaiac negative stool. No penile tenderness.  Musculoskeletal: Normal range of motion. He exhibits no edema and no tenderness.  Lymphadenopathy:    He has no cervical adenopathy.  Neurological: He is alert and oriented to person, place, and time. He has normal reflexes. No cranial nerve deficit. He exhibits normal muscle tone. Coordination normal.  Skin: Skin is warm and dry. No rash noted. He is not diaphoretic. No erythema. No pallor.  Psychiatric: He has a normal mood and affect. His behavior is normal. Judgment and thought content normal.          Assessment & Plan:  Well exam. Get a PSA today. He gets all other labs per Dr. Lucianne Muss. Set up for another colonoscopy.

## 2011-04-01 ENCOUNTER — Ambulatory Visit (INDEPENDENT_AMBULATORY_CARE_PROVIDER_SITE_OTHER): Payer: Medicare Other

## 2011-04-01 DIAGNOSIS — J309 Allergic rhinitis, unspecified: Secondary | ICD-10-CM

## 2011-04-01 LAB — PSA: PSA: 6.47 ng/mL — ABNORMAL HIGH (ref 0.10–4.00)

## 2011-04-05 ENCOUNTER — Ambulatory Visit (INDEPENDENT_AMBULATORY_CARE_PROVIDER_SITE_OTHER): Payer: Medicare Other

## 2011-04-05 DIAGNOSIS — J309 Allergic rhinitis, unspecified: Secondary | ICD-10-CM

## 2011-04-08 ENCOUNTER — Ambulatory Visit (INDEPENDENT_AMBULATORY_CARE_PROVIDER_SITE_OTHER): Payer: Medicare Other

## 2011-04-08 DIAGNOSIS — J309 Allergic rhinitis, unspecified: Secondary | ICD-10-CM

## 2011-04-12 ENCOUNTER — Ambulatory Visit (INDEPENDENT_AMBULATORY_CARE_PROVIDER_SITE_OTHER): Payer: Medicare Other

## 2011-04-12 DIAGNOSIS — J309 Allergic rhinitis, unspecified: Secondary | ICD-10-CM

## 2011-04-15 ENCOUNTER — Ambulatory Visit (INDEPENDENT_AMBULATORY_CARE_PROVIDER_SITE_OTHER): Payer: Medicare Other

## 2011-04-15 DIAGNOSIS — J309 Allergic rhinitis, unspecified: Secondary | ICD-10-CM

## 2011-04-16 ENCOUNTER — Ambulatory Visit (INDEPENDENT_AMBULATORY_CARE_PROVIDER_SITE_OTHER): Payer: Medicare Other

## 2011-04-16 DIAGNOSIS — J309 Allergic rhinitis, unspecified: Secondary | ICD-10-CM

## 2011-04-19 ENCOUNTER — Ambulatory Visit (INDEPENDENT_AMBULATORY_CARE_PROVIDER_SITE_OTHER): Payer: Medicare Other

## 2011-04-19 DIAGNOSIS — J309 Allergic rhinitis, unspecified: Secondary | ICD-10-CM

## 2011-04-22 ENCOUNTER — Encounter: Payer: Self-pay | Admitting: Family Medicine

## 2011-04-22 ENCOUNTER — Ambulatory Visit (INDEPENDENT_AMBULATORY_CARE_PROVIDER_SITE_OTHER)
Admission: RE | Admit: 2011-04-22 | Discharge: 2011-04-22 | Disposition: A | Discharge status: Home / Self Care | Payer: Medicare Other | Source: Ambulatory Visit | Attending: Family Medicine | Admitting: Family Medicine

## 2011-04-22 ENCOUNTER — Ambulatory Visit (INDEPENDENT_AMBULATORY_CARE_PROVIDER_SITE_OTHER): Payer: Medicare Other

## 2011-04-22 ENCOUNTER — Telehealth: Payer: Self-pay | Admitting: Internal Medicine

## 2011-04-22 ENCOUNTER — Ambulatory Visit (INDEPENDENT_AMBULATORY_CARE_PROVIDER_SITE_OTHER): Payer: Medicare Other | Admitting: Family Medicine

## 2011-04-22 VITALS — BP 110/62 | Temp 97.5°F | Wt 233.0 lb

## 2011-04-22 DIAGNOSIS — J309 Allergic rhinitis, unspecified: Secondary | ICD-10-CM

## 2011-04-22 DIAGNOSIS — R0781 Pleurodynia: Secondary | ICD-10-CM

## 2011-04-22 DIAGNOSIS — R079 Chest pain, unspecified: Secondary | ICD-10-CM

## 2011-04-22 LAB — POCT URINALYSIS DIPSTICK
Glucose, UA: NEGATIVE
Ketones, UA: NEGATIVE
Leukocytes, UA: NEGATIVE
pH, UA: 5

## 2011-04-22 MED ORDER — OXYCODONE-ACETAMINOPHEN 5-325 MG PO TABS
1.0000 | ORAL_TABLET | Freq: Three times a day (TID) | ORAL | Status: AC | PRN
Start: 2011-04-22 — End: 2011-05-02

## 2011-04-22 NOTE — Telephone Encounter (Signed)
Patient was offered to be seen by [facility] but would have a wait time of approx 1 hour-pt left and is being seen by PCP Dr Caryl Never.

## 2011-04-22 NOTE — Progress Notes (Signed)
  Subjective:    Patient ID: Cole Morgan, male    DOB: 1940/04/05, 71 y.o.   MRN: [id number]  HPI  Acute visit. 3 days ago onset of right anterior rib cage pain. Denies specific injury. He does recall leaning against a truck but no specific injury. No ecchymosis. No rashes. He has a cough. Does have some pain with deep breathing but no pleuritic pain. No change in bowel habits. No appetite or weight changes. Pain is severe at times and interfering with sleep. No relief with over-the-counter medications. Felt similar with rib fracture several years ago.  He has chronic problems of type 2 diabetes and obstructive sleep apnea. Ex-smoker. Had physical last month PSA over 6.   Review of Systems  Constitutional: Negative for fever and chills.  Respiratory: Negative for cough, shortness of breath and wheezing.   Cardiovascular: Negative for chest pain, palpitations and leg swelling.  Gastrointestinal: Negative for abdominal pain.  Genitourinary: Negative for dysuria and hematuria.  Hematological: Negative for adenopathy. Does not bruise/bleed easily.       Objective:   Physical Exam  Constitutional: He is oriented to person, place, and time. He appears well-developed and well-nourished.  Cardiovascular: Normal rate and regular rhythm.   Pulmonary/Chest: Effort normal and breath sounds normal. No respiratory distress. He has no wheezes. He has no rales.  Abdominal: Soft. He exhibits no distension and no mass. There is no tenderness. There is no rebound and no guarding.  Musculoskeletal:       Patient is extremely tender right anterior rib cage around 12 rib. No masses palpated  Neurological: He is alert and oriented to person, place, and time.  Skin: No rash noted.  Psychiatric: He has a normal mood and affect.          Assessment & Plan:  Acute onset of right anterior rib cage/flank pain. Ace wrap provided. Percocet 5/325 mg 1-2 every 6 hours as a for severe pain. Obtain films  with ribs and chest x-ray. Urine dip negative. Schedule followup with Dr. Clent Ridges in one week Recent elevated PSA.  He will discuss with Dr Clent Ridges at f/u in one week.

## 2011-04-26 ENCOUNTER — Ambulatory Visit (INDEPENDENT_AMBULATORY_CARE_PROVIDER_SITE_OTHER): Payer: Medicare Other

## 2011-04-26 DIAGNOSIS — J309 Allergic rhinitis, unspecified: Secondary | ICD-10-CM

## 2011-04-29 ENCOUNTER — Encounter: Payer: Self-pay | Admitting: Family Medicine

## 2011-04-29 ENCOUNTER — Ambulatory Visit (INDEPENDENT_AMBULATORY_CARE_PROVIDER_SITE_OTHER): Payer: Medicare Other

## 2011-04-29 ENCOUNTER — Ambulatory Visit (INDEPENDENT_AMBULATORY_CARE_PROVIDER_SITE_OTHER): Payer: Medicare Other | Admitting: Family Medicine

## 2011-04-29 VITALS — BP 112/70 | HR 75 | Temp 98.6°F | Wt 236.0 lb

## 2011-04-29 DIAGNOSIS — J309 Allergic rhinitis, unspecified: Secondary | ICD-10-CM

## 2011-04-29 DIAGNOSIS — R079 Chest pain, unspecified: Secondary | ICD-10-CM

## 2011-04-29 DIAGNOSIS — R0781 Pleurodynia: Secondary | ICD-10-CM

## 2011-04-29 DIAGNOSIS — R972 Elevated prostate specific antigen [PSA]: Secondary | ICD-10-CM

## 2011-04-29 NOTE — Progress Notes (Signed)
  Subjective:    Patient ID: Cole Morgan, male    DOB: June 22, 1940, 71 y.o.   MRN: [id number]  HPI Here to follow up right lower chest pains and a recent elevated PSA. He had a PSA drawn here on 03-30-12 that was 6.47. The last one he had drawn was at least 5 years ago, and we do not have that result to compare with. He has also been dealing with sharp pains in the right lower chest for several weeks. He thinks it may have started after he worked several hours on his truck and leaned over the engine. He had a CXR and rib Xrays recently that were normal. The pain is slowly improving. No SOB or cough.    Review of Systems  Constitutional: Negative.   Respiratory: Negative.   Cardiovascular: Positive for chest pain. Negative for palpitations and leg swelling.       Objective:   Physical Exam  Constitutional: He appears well-developed and well-nourished.  Pulmonary/Chest: Effort normal and breath sounds normal. No respiratory distress. He has no wheezes. He has no rales.       Tender in the right anterior lower ribs along the anterior axillary line, no crepitus or masses          Assessment & Plan:  Rib pain, probably due to a costochondral separation. This should resolve over the next few weeks. As for the elevated PSA, we will refer him to Urology.

## 2011-05-03 ENCOUNTER — Ambulatory Visit (INDEPENDENT_AMBULATORY_CARE_PROVIDER_SITE_OTHER): Payer: Medicare Other

## 2011-05-03 DIAGNOSIS — J309 Allergic rhinitis, unspecified: Secondary | ICD-10-CM

## 2011-05-06 ENCOUNTER — Ambulatory Visit (INDEPENDENT_AMBULATORY_CARE_PROVIDER_SITE_OTHER): Payer: Medicare Other

## 2011-05-06 DIAGNOSIS — J309 Allergic rhinitis, unspecified: Secondary | ICD-10-CM

## 2011-05-10 ENCOUNTER — Ambulatory Visit (INDEPENDENT_AMBULATORY_CARE_PROVIDER_SITE_OTHER): Payer: Medicare Other

## 2011-05-10 DIAGNOSIS — J309 Allergic rhinitis, unspecified: Secondary | ICD-10-CM

## 2011-05-12 ENCOUNTER — Encounter: Payer: Self-pay | Admitting: Internal Medicine

## 2011-05-13 ENCOUNTER — Ambulatory Visit (INDEPENDENT_AMBULATORY_CARE_PROVIDER_SITE_OTHER): Payer: Medicare Other

## 2011-05-13 DIAGNOSIS — J309 Allergic rhinitis, unspecified: Secondary | ICD-10-CM

## 2011-05-14 ENCOUNTER — Ambulatory Visit (INDEPENDENT_AMBULATORY_CARE_PROVIDER_SITE_OTHER): Payer: Medicare Other

## 2011-05-14 DIAGNOSIS — J309 Allergic rhinitis, unspecified: Secondary | ICD-10-CM

## 2011-05-17 ENCOUNTER — Ambulatory Visit (INDEPENDENT_AMBULATORY_CARE_PROVIDER_SITE_OTHER): Payer: Medicare Other

## 2011-05-17 ENCOUNTER — Ambulatory Visit (INDEPENDENT_AMBULATORY_CARE_PROVIDER_SITE_OTHER): Payer: Medicare Other | Admitting: Internal Medicine

## 2011-05-17 ENCOUNTER — Encounter: Payer: Self-pay | Admitting: Internal Medicine

## 2011-05-17 VITALS — BP 110/74 | HR 78 | Ht 73.0 in | Wt 238.2 lb

## 2011-05-17 DIAGNOSIS — J309 Allergic rhinitis, unspecified: Secondary | ICD-10-CM

## 2011-05-17 DIAGNOSIS — J209 Acute bronchitis, unspecified: Secondary | ICD-10-CM

## 2011-05-17 DIAGNOSIS — J3089 Other allergic rhinitis: Secondary | ICD-10-CM

## 2011-05-17 MED ORDER — MOMETASONE FUROATE 50 MCG/ACT NA SUSP: 2.0000 | Freq: Every day | NASAL | Status: DC

## 2011-05-17 MED ORDER — OLOPATADINE HCL 0.2 % OP SOLN: 1.0000 [drp] | OPHTHALMIC | Status: AC

## 2011-05-17 MED ORDER — MONTELUKAST SODIUM 10 MG PO TABS: 10.0000 mg | ORAL_TABLET | Freq: Every day | ORAL | Status: DC

## 2011-05-17 MED ORDER — METHYLPREDNISOLONE ACETATE 80 MG/ML IJ SUSP
80.0000 mg | Freq: Once | INTRAMUSCULAR | Status: AC
  Administered 2011-05-17: 80 mg via INTRAMUSCULAR

## 2011-05-17 MED ORDER — PHENYLEPHRINE HCL 1 % NA SOLN
3.0000 [drp] | Freq: Four times a day (QID) | NASAL | Status: DC | PRN
  Administered 2011-05-17: 3 [drp] via NASAL

## 2011-05-17 MED ORDER — HYDROCOD POLST-CHLORPHEN POLST 10-8 MG/5ML PO LQCR: 5.0000 mL | Freq: Two times a day (BID) | ORAL | Status: DC | PRN

## 2011-05-17 NOTE — Progress Notes (Signed)
Patient ID: Cole Morgan, male    DOB: 1940-04-14, 71 y.o.   MRN: [id number]  HPI 71 yo former smoker with hx of allergic rhinitis, skin test positive. Hx obstructive sleep apnea (AHI 15.4/hr)  intolerant of CPAP. Hx of multiple traumas, mostly from horse-riding injuries. I last saw him 2006. He wants reassessment of his allergy status. Blames allergy for making him sleepy. Describes bronchitis each winter, which he attributes to allergy rather than colds- with productive cough, head and chest congestion. Went to [facility] ER twice this winter with nasal and chest congestion treated with antibiotics, prednisone taper, nose spray. He has been much better in the past month. Today he notes productive cough, white phlegm. He expects to treat this as an acute event, after which he expects to stay well through the summer. Denies fever, purulent mucus, blood, nodes, sneeze.  03/17/11- 72 yo former smoker with hx of allergic rhinitis, allergic conjunctivitis, skin test positive, recurrent bronchitis. Hx obstructive sleep apnea (AHI 15.4/hr)  intolerant of CPAP. Hx of multiple traumas, mostly from horse-riding injuries. He wants reassessment of his allergy status. Complains of burning itch in throat when he tries to sleep, burning, itching and watering of eyes and nose on waking in AM. Says he has tried multiple otc allergy meds and eye drops w/o benefit.  Allergy Testing-especially positive for house dust but also for numerous grass, weed and tree pollens.  05/17/11- 72 yo former smoker with hx of allergic rhinitis, allergic conjunctivitis, skin test positive, recurrent bronchitis. Hx obstructive sleep apnea (AHI 15.4/hr)  intolerant of CPAP. Hx of multiple traumas, mostly from horse-riding injuries. Increased chest congestion and irritation of eyes and nose with itching and nasal drainage mostly in the last 5 days. He has been outdoors a lot. Much cough but not wheezing. He is building allergy vaccine without  problems.   Review of Systems-See HPI Constitutional:   No-   weight loss, night sweats, fevers, chills, fatigue, lassitude. HEENT:   No-  headaches, difficulty swallowing, tooth/dental problems, sore throat,       +  sneezing, itching, ear ache, nasal congestion, post nasal drip,  CV:  No-   chest pain, orthopnea, PND, swelling in lower extremities, anasarca, dizziness, palpitations Resp: No- acute  shortness of breath with exertion or at rest.              No-   productive cough,  + non-productive cough,  No- coughing up of blood.              No-   change in color of mucus.  No- wheezing.   Skin: No-   rash or lesions. GI:  No-   heartburn, indigestion, abdominal pain, nausea, vomiting, diarrhea,                 change in bowel habits, loss of appetite GU: MS:  No-   joint pain or swelling.  Neuro-     nothing unusual Psych:  No- change in mood or affect. No depression or anxiety.  No memory loss.   Objective:   Physical Exam General- Alert, Oriented, Affect-appropriate, Distress- none acute Skin- rash-none, lesions- none, excoriation- none. Very dry skin w/ some eczema in ears. Lymphadenopathy- none Head- atraumatic            Eyes- Gross vision intact, PERRLA, conjunctivae red            Ears- canals red, excoriated, question fluid L>R  Nose- turbinate edema.  no-Septal dev, mucus, polyps, erosion, perforation             Throat- Mallampati II , mucosa clear , drainage- none, tonsils- atrophic Neck- flexible , trachea midline, no stridor , thyroid nl, carotid no bruit Chest - symmetrical excursion , unlabored           Heart/CV- RRR , no murmur , no gallop  , no rub, nl s1 s2                           - JVD- none , edema- none, stasis changes- none, varices- none           Lung- clear to P&A, wheeze- none, cough- none , dullness-none, rub- none           Chest wall- surgical scars Abd-  Br/ Gen/ Rectal- Not done, not indicated Extrem- cyanosis- none, clubbing,  none, atrophy- none, strength- nl Neuro- grossly intact to observation

## 2011-05-17 NOTE — Patient Instructions (Signed)
Continue allergy vaccine build-up  Neo neb nasal  Depo 80  Refill scripts for meds, including new trial of Singulair/ montelukast- 1 daily

## 2011-05-20 ENCOUNTER — Ambulatory Visit (INDEPENDENT_AMBULATORY_CARE_PROVIDER_SITE_OTHER): Payer: Medicare Other

## 2011-05-20 DIAGNOSIS — J309 Allergic rhinitis, unspecified: Secondary | ICD-10-CM

## 2011-05-21 ENCOUNTER — Encounter: Payer: Self-pay | Admitting: Internal Medicine

## 2011-05-21 NOTE — Assessment & Plan Note (Signed)
Plan-Depo-Medrol, Tussionex, Singulair.

## 2011-05-21 NOTE — Assessment & Plan Note (Signed)
Seasonal worsening of allergic conjunctivitis and rhinitis. He is too early in his allergy vaccine buildup for major control yet. Plan-sample Pataday, Nasonex, Singulair. Nasal nebulizer, Depo-Medrol.

## 2011-05-24 ENCOUNTER — Ambulatory Visit (INDEPENDENT_AMBULATORY_CARE_PROVIDER_SITE_OTHER): Payer: Medicare Other

## 2011-05-24 DIAGNOSIS — J309 Allergic rhinitis, unspecified: Secondary | ICD-10-CM

## 2011-05-27 ENCOUNTER — Ambulatory Visit (INDEPENDENT_AMBULATORY_CARE_PROVIDER_SITE_OTHER): Payer: Medicare Other

## 2011-05-27 DIAGNOSIS — J309 Allergic rhinitis, unspecified: Secondary | ICD-10-CM

## 2011-05-31 ENCOUNTER — Ambulatory Visit (INDEPENDENT_AMBULATORY_CARE_PROVIDER_SITE_OTHER): Payer: Medicare Other

## 2011-05-31 DIAGNOSIS — J309 Allergic rhinitis, unspecified: Secondary | ICD-10-CM

## 2011-06-03 ENCOUNTER — Ambulatory Visit (INDEPENDENT_AMBULATORY_CARE_PROVIDER_SITE_OTHER): Payer: Medicare Other

## 2011-06-03 DIAGNOSIS — J309 Allergic rhinitis, unspecified: Secondary | ICD-10-CM

## 2011-06-07 ENCOUNTER — Ambulatory Visit (INDEPENDENT_AMBULATORY_CARE_PROVIDER_SITE_OTHER): Payer: Medicare Other

## 2011-06-07 DIAGNOSIS — J309 Allergic rhinitis, unspecified: Secondary | ICD-10-CM

## 2011-06-10 ENCOUNTER — Ambulatory Visit (INDEPENDENT_AMBULATORY_CARE_PROVIDER_SITE_OTHER): Payer: Medicare Other

## 2011-06-10 DIAGNOSIS — J309 Allergic rhinitis, unspecified: Secondary | ICD-10-CM

## 2011-06-15 ENCOUNTER — Ambulatory Visit (INDEPENDENT_AMBULATORY_CARE_PROVIDER_SITE_OTHER): Payer: Medicare Other

## 2011-06-15 DIAGNOSIS — J309 Allergic rhinitis, unspecified: Secondary | ICD-10-CM

## 2011-06-21 ENCOUNTER — Ambulatory Visit (INDEPENDENT_AMBULATORY_CARE_PROVIDER_SITE_OTHER): Payer: Medicare Other

## 2011-06-21 DIAGNOSIS — J309 Allergic rhinitis, unspecified: Secondary | ICD-10-CM

## 2011-06-28 ENCOUNTER — Ambulatory Visit (INDEPENDENT_AMBULATORY_CARE_PROVIDER_SITE_OTHER): Payer: Medicare Other

## 2011-06-28 DIAGNOSIS — J309 Allergic rhinitis, unspecified: Secondary | ICD-10-CM

## 2011-07-02 ENCOUNTER — Encounter: Payer: Self-pay | Admitting: Internal Medicine

## 2011-07-05 ENCOUNTER — Ambulatory Visit (INDEPENDENT_AMBULATORY_CARE_PROVIDER_SITE_OTHER): Payer: Medicare Other

## 2011-07-05 DIAGNOSIS — J309 Allergic rhinitis, unspecified: Secondary | ICD-10-CM

## 2011-07-12 ENCOUNTER — Ambulatory Visit (INDEPENDENT_AMBULATORY_CARE_PROVIDER_SITE_OTHER): Payer: Medicare Other

## 2011-07-12 DIAGNOSIS — J309 Allergic rhinitis, unspecified: Secondary | ICD-10-CM

## 2011-07-19 ENCOUNTER — Ambulatory Visit (INDEPENDENT_AMBULATORY_CARE_PROVIDER_SITE_OTHER): Payer: Medicare Other

## 2011-07-19 DIAGNOSIS — J309 Allergic rhinitis, unspecified: Secondary | ICD-10-CM

## 2011-07-26 ENCOUNTER — Ambulatory Visit (INDEPENDENT_AMBULATORY_CARE_PROVIDER_SITE_OTHER): Payer: Medicare Other

## 2011-07-26 DIAGNOSIS — J309 Allergic rhinitis, unspecified: Secondary | ICD-10-CM

## 2011-08-02 ENCOUNTER — Ambulatory Visit (INDEPENDENT_AMBULATORY_CARE_PROVIDER_SITE_OTHER): Payer: Medicare Other

## 2011-08-02 DIAGNOSIS — J309 Allergic rhinitis, unspecified: Secondary | ICD-10-CM

## 2011-08-11 ENCOUNTER — Ambulatory Visit (INDEPENDENT_AMBULATORY_CARE_PROVIDER_SITE_OTHER): Payer: Medicare Other

## 2011-08-11 DIAGNOSIS — J309 Allergic rhinitis, unspecified: Secondary | ICD-10-CM

## 2011-08-17 ENCOUNTER — Ambulatory Visit (INDEPENDENT_AMBULATORY_CARE_PROVIDER_SITE_OTHER): Payer: Medicare Other

## 2011-08-17 ENCOUNTER — Encounter: Payer: Self-pay | Admitting: Internal Medicine

## 2011-08-17 ENCOUNTER — Ambulatory Visit (INDEPENDENT_AMBULATORY_CARE_PROVIDER_SITE_OTHER): Payer: Medicare Other | Admitting: Internal Medicine

## 2011-08-17 VITALS — BP 144/66 | HR 87 | Ht 73.0 in | Wt 234.6 lb

## 2011-08-17 DIAGNOSIS — J309 Allergic rhinitis, unspecified: Secondary | ICD-10-CM

## 2011-08-17 DIAGNOSIS — J3089 Other allergic rhinitis: Secondary | ICD-10-CM

## 2011-08-17 NOTE — Patient Instructions (Addendum)
I will have the allergy lab make a vaccine with molds that we can build up to merge with your current vaccine. Then we can raise the total strength.   If you haven't tried- Allaway otc eye drops.

## 2011-08-17 NOTE — Progress Notes (Signed)
Patient ID: Cole Morgan, male    DOB: Aug 02, 1940, 71 y.o.   MRN: [id number]  HPI 71 yo former smoker with hx of allergic rhinitis, skin test positive. Hx obstructive sleep apnea (AHI 15.4/hr)  intolerant of CPAP. Hx of multiple traumas, mostly from horse-riding injuries. I last saw him 2006. He wants reassessment of his allergy status. Blames allergy for making him sleepy. Describes bronchitis each winter, which he attributes to allergy rather than colds- with productive cough, head and chest congestion. Went to [facility] ER twice this winter with nasal and chest congestion treated with antibiotics, prednisone taper, nose spray. He has been much better in the past month. Today he notes productive cough, white phlegm. He expects to treat this as an acute event, after which he expects to stay well through the summer. Denies fever, purulent mucus, blood, nodes, sneeze.  03/17/11- 20 yo former smoker with hx of allergic rhinitis, allergic conjunctivitis, skin test positive, recurrent bronchitis. Hx obstructive sleep apnea (AHI 15.4/hr)  intolerant of CPAP. Hx of multiple traumas, mostly from horse-riding injuries. He wants reassessment of his allergy status. Complains of burning itch in throat when he tries to sleep, burning, itching and watering of eyes and nose on waking in AM. Says he has tried multiple otc allergy meds and eye drops w/o benefit.  Allergy Testing-especially positive for house dust but also for numerous grass, weed and tree pollens.  05/17/11- 10 yo former smoker with hx of allergic rhinitis, allergic conjunctivitis, skin test positive, recurrent bronchitis. Hx obstructive sleep apnea (AHI 15.4/hr)  intolerant of CPAP. Hx of multiple traumas, mostly from horse-riding injuries. Increased chest congestion and irritation of eyes and nose with itching and nasal drainage mostly in the last 5 days. He has been outdoors a lot. Much cough but not wheezing. He is building allergy vaccine without  problems.  08/17/11- 43 yo former smoker with hx of allergic rhinitis, allergic conjunctivitis, skin test positive, recurrent bronchitis. Hx obstructive sleep apnea (AHI 15.4/hr)  intolerant of CPAP. Hx of multiple traumas, mostly from horse-riding injuries. Still on vaccine; unsure if allergy injections are really helping. 10 out of 12 months still has increased troubles. 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 We reviewed his skin test results from January. His throat itches some days when he goes into the barn. He did not react to horse but he reacted strongly to grass and less strongly to a number of molds. 10 months of the year he says his eyes itch. He has a history of allergic conjunctivitis when exposed to colds. Somatic pains associated with all of his many orthopedic injuries keep him awake at night.  Review of Systems-See HPI Constitutional:   No-   weight loss, night sweats, fevers, chills, fatigue, lassitude. HEENT:   No-  headaches, difficulty swallowing, tooth/dental problems, sore throat,       +  sneezing, itching, ear ache, nasal congestion, post nasal drip,  CV:  No-   chest pain, orthopnea, PND, swelling in lower extremities, anasarca, dizziness, palpitations Resp: No- acute  shortness of breath with exertion or at rest.              No-   productive cough,  + non-productive cough,  No- coughing up of blood.              No-   change in color of mucus.  No- wheezing.   Skin: No-   rash or lesions. GI:  No-  heartburn, indigestion, abdominal pain, nausea, vomiting,  GU: MS:  +   joint pain or swelling.  Neuro-     nothing unusual Psych:  No- change in mood or affect. No depression or anxiety.  No memory loss.   Objective:   Physical Exam General- Alert, Oriented, Affect-appropriate, Distress- none acute Skin- rash-none, lesions- none, excoriation- none. Very dry skin w/ some eczema in ears. Lymphadenopathy- none Head- atraumatic            Eyes- Gross  vision intact, PERRLA, conjunctivae red            Ears- canals red, excoriated, question fluid L>R            Nose- turbinate edema.  no-Septal dev, mucus, polyps, erosion, perforation             Throat- Mallampati II , mucosa clear , drainage- none, tonsils- atrophic Neck- flexible , trachea midline, no stridor , thyroid nl, carotid no bruit Chest - symmetrical excursion , unlabored           Heart/CV- RRR , no murmur , no gallop  , no rub, nl s1 s2                           - JVD- none , edema- none, stasis changes- none, varices- none           Lung- clear to P&A, wheeze- none, cough- none , dullness-none, rub- none           Chest wall- surgical scars Abd-  Br/ Gen/ Rectal- Not done, not indicated Extrem- cyanosis- none, clubbing, none, atrophy- none, strength- nl Neuro- grossly intact to observation

## 2011-08-22 NOTE — Assessment & Plan Note (Addendum)
We reviewed his skin test pattern and his history. I think the triggers he is associating with the barn are related to hay, straw and the mold. Plan-we can build the third bile of allergy vaccine to include molds. Suggested otc allergy eye drop brands to try, since Pataday and equivalents are so expensive.

## 2011-08-26 ENCOUNTER — Ambulatory Visit (INDEPENDENT_AMBULATORY_CARE_PROVIDER_SITE_OTHER): Payer: Medicare Other

## 2011-08-26 DIAGNOSIS — J309 Allergic rhinitis, unspecified: Secondary | ICD-10-CM

## 2011-08-30 ENCOUNTER — Ambulatory Visit (INDEPENDENT_AMBULATORY_CARE_PROVIDER_SITE_OTHER): Payer: Medicare Other

## 2011-08-30 DIAGNOSIS — J309 Allergic rhinitis, unspecified: Secondary | ICD-10-CM

## 2011-08-31 ENCOUNTER — Ambulatory Visit (INDEPENDENT_AMBULATORY_CARE_PROVIDER_SITE_OTHER): Payer: Medicare Other

## 2011-08-31 DIAGNOSIS — J309 Allergic rhinitis, unspecified: Secondary | ICD-10-CM

## 2011-09-07 ENCOUNTER — Ambulatory Visit (INDEPENDENT_AMBULATORY_CARE_PROVIDER_SITE_OTHER): Payer: Medicare Other

## 2011-09-07 DIAGNOSIS — J309 Allergic rhinitis, unspecified: Secondary | ICD-10-CM

## 2011-09-15 ENCOUNTER — Ambulatory Visit (INDEPENDENT_AMBULATORY_CARE_PROVIDER_SITE_OTHER): Payer: Medicare Other

## 2011-09-15 DIAGNOSIS — J309 Allergic rhinitis, unspecified: Secondary | ICD-10-CM

## 2011-09-20 ENCOUNTER — Ambulatory Visit (INDEPENDENT_AMBULATORY_CARE_PROVIDER_SITE_OTHER): Payer: Medicare Other

## 2011-09-20 DIAGNOSIS — J309 Allergic rhinitis, unspecified: Secondary | ICD-10-CM

## 2011-09-24 ENCOUNTER — Ambulatory Visit (INDEPENDENT_AMBULATORY_CARE_PROVIDER_SITE_OTHER): Payer: Medicare Other

## 2011-09-24 DIAGNOSIS — J309 Allergic rhinitis, unspecified: Secondary | ICD-10-CM

## 2011-09-27 ENCOUNTER — Ambulatory Visit (INDEPENDENT_AMBULATORY_CARE_PROVIDER_SITE_OTHER): Payer: Medicare Other

## 2011-09-27 DIAGNOSIS — J309 Allergic rhinitis, unspecified: Secondary | ICD-10-CM

## 2011-10-01 ENCOUNTER — Ambulatory Visit (INDEPENDENT_AMBULATORY_CARE_PROVIDER_SITE_OTHER): Payer: Medicare Other

## 2011-10-01 DIAGNOSIS — J309 Allergic rhinitis, unspecified: Secondary | ICD-10-CM

## 2011-10-04 ENCOUNTER — Ambulatory Visit (INDEPENDENT_AMBULATORY_CARE_PROVIDER_SITE_OTHER): Payer: Medicare Other

## 2011-10-04 DIAGNOSIS — J309 Allergic rhinitis, unspecified: Secondary | ICD-10-CM

## 2011-10-05 ENCOUNTER — Encounter: Payer: Self-pay | Admitting: Internal Medicine

## 2011-10-11 ENCOUNTER — Ambulatory Visit (INDEPENDENT_AMBULATORY_CARE_PROVIDER_SITE_OTHER): Payer: Medicare Other

## 2011-10-11 DIAGNOSIS — J309 Allergic rhinitis, unspecified: Secondary | ICD-10-CM

## 2011-10-19 ENCOUNTER — Ambulatory Visit (INDEPENDENT_AMBULATORY_CARE_PROVIDER_SITE_OTHER): Payer: Medicare Other

## 2011-10-19 DIAGNOSIS — J309 Allergic rhinitis, unspecified: Secondary | ICD-10-CM

## 2011-10-20 ENCOUNTER — Ambulatory Visit (INDEPENDENT_AMBULATORY_CARE_PROVIDER_SITE_OTHER): Payer: Medicare Other

## 2011-10-20 DIAGNOSIS — J309 Allergic rhinitis, unspecified: Secondary | ICD-10-CM

## 2011-10-26 ENCOUNTER — Ambulatory Visit (INDEPENDENT_AMBULATORY_CARE_PROVIDER_SITE_OTHER): Payer: Medicare Other

## 2011-10-26 DIAGNOSIS — J309 Allergic rhinitis, unspecified: Secondary | ICD-10-CM

## 2011-11-01 ENCOUNTER — Ambulatory Visit (INDEPENDENT_AMBULATORY_CARE_PROVIDER_SITE_OTHER): Payer: Medicare Other

## 2011-11-01 DIAGNOSIS — J309 Allergic rhinitis, unspecified: Secondary | ICD-10-CM

## 2011-11-02 ENCOUNTER — Encounter: Payer: Self-pay | Admitting: Internal Medicine

## 2011-11-09 ENCOUNTER — Ambulatory Visit (INDEPENDENT_AMBULATORY_CARE_PROVIDER_SITE_OTHER): Payer: Medicare Other

## 2011-11-09 DIAGNOSIS — J309 Allergic rhinitis, unspecified: Secondary | ICD-10-CM

## 2011-11-12 ENCOUNTER — Ambulatory Visit (INDEPENDENT_AMBULATORY_CARE_PROVIDER_SITE_OTHER): Payer: Medicare Other

## 2011-11-12 DIAGNOSIS — J309 Allergic rhinitis, unspecified: Secondary | ICD-10-CM

## 2011-11-19 ENCOUNTER — Ambulatory Visit (INDEPENDENT_AMBULATORY_CARE_PROVIDER_SITE_OTHER): Payer: Medicare Other

## 2011-11-19 DIAGNOSIS — J309 Allergic rhinitis, unspecified: Secondary | ICD-10-CM

## 2011-11-22 ENCOUNTER — Ambulatory Visit (INDEPENDENT_AMBULATORY_CARE_PROVIDER_SITE_OTHER): Payer: Medicare Other

## 2011-11-22 ENCOUNTER — Ambulatory Visit: Payer: Medicare Other | Admitting: Internal Medicine

## 2011-11-22 DIAGNOSIS — J309 Allergic rhinitis, unspecified: Secondary | ICD-10-CM

## 2011-11-26 ENCOUNTER — Ambulatory Visit (INDEPENDENT_AMBULATORY_CARE_PROVIDER_SITE_OTHER): Payer: Medicare Other

## 2011-11-26 DIAGNOSIS — J309 Allergic rhinitis, unspecified: Secondary | ICD-10-CM

## 2011-11-30 ENCOUNTER — Ambulatory Visit (INDEPENDENT_AMBULATORY_CARE_PROVIDER_SITE_OTHER): Payer: Medicare Other

## 2011-11-30 DIAGNOSIS — J309 Allergic rhinitis, unspecified: Secondary | ICD-10-CM

## 2011-12-02 ENCOUNTER — Ambulatory Visit (INDEPENDENT_AMBULATORY_CARE_PROVIDER_SITE_OTHER): Payer: Medicare Other

## 2011-12-02 DIAGNOSIS — J309 Allergic rhinitis, unspecified: Secondary | ICD-10-CM

## 2011-12-06 ENCOUNTER — Encounter: Payer: Self-pay | Admitting: Internal Medicine

## 2011-12-07 ENCOUNTER — Encounter: Payer: Self-pay | Admitting: Internal Medicine

## 2011-12-07 ENCOUNTER — Ambulatory Visit (INDEPENDENT_AMBULATORY_CARE_PROVIDER_SITE_OTHER): Payer: Medicare Other

## 2011-12-07 ENCOUNTER — Ambulatory Visit (INDEPENDENT_AMBULATORY_CARE_PROVIDER_SITE_OTHER): Payer: Medicare Other | Admitting: Internal Medicine

## 2011-12-07 VITALS — BP 136/68 | HR 80 | Ht 73.0 in | Wt 235.4 lb

## 2011-12-07 DIAGNOSIS — J309 Allergic rhinitis, unspecified: Secondary | ICD-10-CM

## 2011-12-07 DIAGNOSIS — J302 Other seasonal allergic rhinitis: Secondary | ICD-10-CM

## 2011-12-07 DIAGNOSIS — G4733 Obstructive sleep apnea (adult) (pediatric): Secondary | ICD-10-CM

## 2011-12-07 MED ORDER — MOMETASONE FUROATE 50 MCG/ACT NA SUSP: 2.0000 | Freq: Every day | NASAL | Status: DC

## 2011-12-07 MED ORDER — AZELASTINE-FLUTICASONE 137-50 MCG/ACT NA SUSP: 2.0000 | Freq: Every day | NASAL | Status: DC

## 2011-12-07 NOTE — Progress Notes (Signed)
Patient ID: Cole Morgan, male    DOB: 11/21/1940, 71 y.o.   MRN: [id number]  HPI 71 yo former smoker with hx of allergic rhinitis, skin test positive. Hx obstructive sleep apnea (AHI 15.4/hr)  intolerant of CPAP. Hx of multiple traumas, mostly from horse-riding injuries. I last saw him 2006. He wants reassessment of his allergy status. Blames allergy for making him sleepy. Describes bronchitis each winter, which he attributes to allergy rather than colds- with productive cough, head and chest congestion. Went to [facility] ER twice this winter with nasal and chest congestion treated with antibiotics, prednisone taper, nose spray. He has been much better in the past month. Today he notes productive cough, white phlegm. He expects to treat this as an acute event, after which he expects to stay well through the summer. Denies fever, purulent mucus, blood, nodes, sneeze.  03/17/11- 50 yo former smoker with hx of allergic rhinitis, allergic conjunctivitis, skin test positive, recurrent bronchitis. Hx obstructive sleep apnea (AHI 15.4/hr)  intolerant of CPAP. Hx of multiple traumas, mostly from horse-riding injuries. He wants reassessment of his allergy status. Complains of burning itch in throat when he tries to sleep, burning, itching and watering of eyes and nose on waking in AM. Says he has tried multiple otc allergy meds and eye drops w/o benefit.  Allergy Testing-especially positive for house dust but also for numerous grass, weed and tree pollens.  05/17/11- 52 yo former smoker with hx of allergic rhinitis, allergic conjunctivitis, skin test positive, recurrent bronchitis. Hx obstructive sleep apnea (AHI 15.4/hr)  intolerant of CPAP. Hx of multiple traumas, mostly from horse-riding injuries. Increased chest congestion and irritation of eyes and nose with itching and nasal drainage mostly in the last 5 days. He has been outdoors a lot. Much cough but not wheezing. He is building allergy vaccine without  problems.  08/17/11- 73 yo former smoker with hx of allergic rhinitis, allergic conjunctivitis, skin test positive, recurrent bronchitis. Hx obstructive sleep apnea (AHI 15.4/hr)  intolerant of CPAP. Hx of multiple traumas, mostly from horse-riding injuries. Still on vaccine; unsure if allergy injections are really helping. 10 out of 12 months still has increased troubles. 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 We reviewed his skin test results from January. His throat itches some days when he goes into the barn. He did not react to horse but he reacted strongly to grass and less strongly to a number of molds. 10 months of the year he says his eyes itch. He has a history of allergic conjunctivitis when exposed to colds. Somatic pains associated with all of his many orthopedic injuries keep him awake at night.  12/07/11- 6 yo former smoker with hx of allergic rhinitis, allergic conjunctivitis, skin test positive, recurrent bronchitis. Hx obstructive sleep apnea (AHI 15.4/hr)  intolerant of CPAP. Hx of multiple traumas, mostly from horse-riding injuries. Had flu vaccine Still on allergy vaccine (A 1:50, B 1:500) GH,  and ran out of Nasonex; having flare ups since running out. 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 Statins have been causing significant myalgias and he feels better since they were stopped.  Review of Systems-See HPI Constitutional:   No-   weight loss, night sweats, fevers, chills, fatigue, lassitude. HEENT:   No-  headaches, difficulty swallowing, tooth/dental problems, sore throat,       +  sneezing, itching, ear ache, nasal congestion, post nasal drip,  CV:  No-   chest pain, orthopnea, PND, swelling  in lower extremities, anasarca, dizziness, palpitations Resp: No- acute  shortness of breath with exertion or at rest.              No-   productive cough,  + non-productive cough,  No- coughing up of blood.              No-   change in color of mucus.  No-  wheezing.   Skin: No-   rash or lesions. GI:  No-   heartburn, indigestion, abdominal pain, nausea, vomiting,  GU: MS:  +   joint pain or swelling.  Neuro-     nothing unusual Psych:  No- change in mood or affect. No depression or anxiety.  No memory loss.   Objective:   Physical Exam General- Alert, Oriented, Affect-appropriate, Distress- none acute Skin- rash-none, lesions- none, excoriation- none. Very dry skin w/ some eczema in ears. Lymphadenopathy- none Head- atraumatic            Eyes- Gross vision intact, PERRLA, conjunctivae red            Ears- canals red, excoriated, question fluid L>R            Nose- + pale mucosa.  no-Septal dev, mucus, polyps, erosion, perforation             Throat- Mallampati II , mucosa clear , drainage- none, tonsils- atrophic Neck- flexible , trachea midline, no stridor , thyroid nl, carotid no bruit Chest - symmetrical excursion , unlabored           Heart/CV- RRR , no murmur , no gallop  , no rub, nl s1 s2                           - JVD- none , edema- none, stasis changes- none, varices- none           Lung- clear to P&A, wheeze- none, cough- none , dullness-none, rub- none           Chest wall- surgical scars Abd-  Br/ Gen/ Rectal- Not done, not indicated Extrem- cyanosis- none, clubbing, none, atrophy- none, strength- nl Neuro- grossly intact to observation

## 2011-12-07 NOTE — Patient Instructions (Addendum)
Sample Dymista nasal spray      1-2 puffs each nostril once daily at bedtime  Script for Nasonex refill   We are continuing allergy vaccine while the vial with mold builds to catch up, so we can merge them.

## 2011-12-15 ENCOUNTER — Ambulatory Visit (INDEPENDENT_AMBULATORY_CARE_PROVIDER_SITE_OTHER): Payer: Medicare Other

## 2011-12-15 DIAGNOSIS — J309 Allergic rhinitis, unspecified: Secondary | ICD-10-CM

## 2011-12-17 NOTE — Assessment & Plan Note (Addendum)
He is on the highest dose of allergy vaccine he could tolerate without excessive local reaction. 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 There has been no systemic problem. 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

## 2011-12-17 NOTE — Assessment & Plan Note (Signed)
We discussed options again. He not prepared to address this now.

## 2011-12-21 ENCOUNTER — Ambulatory Visit (INDEPENDENT_AMBULATORY_CARE_PROVIDER_SITE_OTHER): Payer: Medicare Other

## 2011-12-21 DIAGNOSIS — J309 Allergic rhinitis, unspecified: Secondary | ICD-10-CM

## 2011-12-27 ENCOUNTER — Ambulatory Visit (INDEPENDENT_AMBULATORY_CARE_PROVIDER_SITE_OTHER): Payer: Medicare Other

## 2011-12-27 DIAGNOSIS — J309 Allergic rhinitis, unspecified: Secondary | ICD-10-CM

## 2012-01-03 ENCOUNTER — Ambulatory Visit (INDEPENDENT_AMBULATORY_CARE_PROVIDER_SITE_OTHER): Payer: Medicare Other

## 2012-01-03 DIAGNOSIS — J309 Allergic rhinitis, unspecified: Secondary | ICD-10-CM

## 2012-01-11 ENCOUNTER — Ambulatory Visit (INDEPENDENT_AMBULATORY_CARE_PROVIDER_SITE_OTHER): Payer: Medicare Other

## 2012-01-11 DIAGNOSIS — J309 Allergic rhinitis, unspecified: Secondary | ICD-10-CM

## 2012-01-19 ENCOUNTER — Ambulatory Visit (INDEPENDENT_AMBULATORY_CARE_PROVIDER_SITE_OTHER): Payer: Medicare Other

## 2012-01-19 DIAGNOSIS — J309 Allergic rhinitis, unspecified: Secondary | ICD-10-CM

## 2012-01-31 ENCOUNTER — Ambulatory Visit (INDEPENDENT_AMBULATORY_CARE_PROVIDER_SITE_OTHER): Payer: Medicare Other

## 2012-01-31 DIAGNOSIS — J309 Allergic rhinitis, unspecified: Secondary | ICD-10-CM

## 2012-02-07 ENCOUNTER — Ambulatory Visit (INDEPENDENT_AMBULATORY_CARE_PROVIDER_SITE_OTHER): Payer: Medicare Other

## 2012-02-07 DIAGNOSIS — J309 Allergic rhinitis, unspecified: Secondary | ICD-10-CM

## 2012-02-11 ENCOUNTER — Encounter: Payer: Self-pay | Admitting: Internal Medicine

## 2012-02-14 ENCOUNTER — Ambulatory Visit (INDEPENDENT_AMBULATORY_CARE_PROVIDER_SITE_OTHER): Payer: Medicare Other

## 2012-02-14 DIAGNOSIS — J309 Allergic rhinitis, unspecified: Secondary | ICD-10-CM

## 2012-02-15 ENCOUNTER — Ambulatory Visit (INDEPENDENT_AMBULATORY_CARE_PROVIDER_SITE_OTHER): Payer: Medicare Other

## 2012-02-15 DIAGNOSIS — J309 Allergic rhinitis, unspecified: Secondary | ICD-10-CM

## 2012-02-22 ENCOUNTER — Ambulatory Visit (INDEPENDENT_AMBULATORY_CARE_PROVIDER_SITE_OTHER): Payer: Medicare Other

## 2012-02-22 DIAGNOSIS — J309 Allergic rhinitis, unspecified: Secondary | ICD-10-CM

## 2012-02-28 ENCOUNTER — Ambulatory Visit (INDEPENDENT_AMBULATORY_CARE_PROVIDER_SITE_OTHER): Payer: Medicare Other

## 2012-02-28 DIAGNOSIS — J309 Allergic rhinitis, unspecified: Secondary | ICD-10-CM

## 2012-03-06 ENCOUNTER — Ambulatory Visit (INDEPENDENT_AMBULATORY_CARE_PROVIDER_SITE_OTHER): Payer: Medicare Other

## 2012-03-06 DIAGNOSIS — J309 Allergic rhinitis, unspecified: Secondary | ICD-10-CM

## 2012-03-13 ENCOUNTER — Ambulatory Visit (INDEPENDENT_AMBULATORY_CARE_PROVIDER_SITE_OTHER): Payer: Medicare Other

## 2012-03-13 DIAGNOSIS — J309 Allergic rhinitis, unspecified: Secondary | ICD-10-CM

## 2012-03-14 ENCOUNTER — Ambulatory Visit (INDEPENDENT_AMBULATORY_CARE_PROVIDER_SITE_OTHER): Payer: Medicare Other

## 2012-03-14 DIAGNOSIS — J309 Allergic rhinitis, unspecified: Secondary | ICD-10-CM

## 2012-03-16 ENCOUNTER — Ambulatory Visit: Payer: Medicare Other

## 2012-03-20 ENCOUNTER — Ambulatory Visit (INDEPENDENT_AMBULATORY_CARE_PROVIDER_SITE_OTHER): Payer: Medicare Other

## 2012-03-20 DIAGNOSIS — J309 Allergic rhinitis, unspecified: Secondary | ICD-10-CM

## 2012-03-27 ENCOUNTER — Ambulatory Visit (INDEPENDENT_AMBULATORY_CARE_PROVIDER_SITE_OTHER): Payer: Medicare Other

## 2012-03-27 DIAGNOSIS — J309 Allergic rhinitis, unspecified: Secondary | ICD-10-CM

## 2012-03-30 ENCOUNTER — Encounter: Payer: Self-pay | Admitting: Internal Medicine

## 2012-04-03 ENCOUNTER — Ambulatory Visit (INDEPENDENT_AMBULATORY_CARE_PROVIDER_SITE_OTHER): Payer: Medicare Other

## 2012-04-03 DIAGNOSIS — J309 Allergic rhinitis, unspecified: Secondary | ICD-10-CM

## 2012-04-04 ENCOUNTER — Encounter: Payer: Self-pay | Admitting: Family Medicine

## 2012-04-04 ENCOUNTER — Ambulatory Visit (INDEPENDENT_AMBULATORY_CARE_PROVIDER_SITE_OTHER): Payer: Medicare Other | Admitting: Family Medicine

## 2012-04-04 VITALS — BP 114/76 | HR 84 | Temp 98.1°F | Wt 225.0 lb

## 2012-04-04 DIAGNOSIS — R42 Dizziness and giddiness: Secondary | ICD-10-CM

## 2012-04-04 DIAGNOSIS — L409 Psoriasis, unspecified: Secondary | ICD-10-CM

## 2012-04-04 DIAGNOSIS — G4700 Insomnia, unspecified: Secondary | ICD-10-CM

## 2012-04-04 DIAGNOSIS — L408 Other psoriasis: Secondary | ICD-10-CM

## 2012-04-04 DIAGNOSIS — Z9109 Other allergy status, other than to drugs and biological substances: Secondary | ICD-10-CM

## 2012-04-04 MED ORDER — TRAZODONE HCL 50 MG PO TABS: 50.0000 mg | ORAL_TABLET | Freq: Every day | ORAL | Status: DC

## 2012-04-04 MED ORDER — AZELASTINE-FLUTICASONE 137-50 MCG/ACT NA SUSP: 2.0000 | Freq: Every day | NASAL | Status: DC

## 2012-04-04 MED ORDER — MOMETASONE FUROATE 50 MCG/ACT NA SUSP: 2.0000 | Freq: Every day | NASAL | Status: DC

## 2012-04-04 MED ORDER — METHYLPREDNISOLONE ACETATE 80 MG/ML IJ SUSP
120.0000 mg | Freq: Once | INTRAMUSCULAR | Status: AC
  Administered 2012-04-04: 120 mg via INTRAMUSCULAR

## 2012-04-04 MED ORDER — CLOTRIMAZOLE-BETAMETHASONE 1-0.05 % EX CREA: TOPICAL_CREAM | Freq: Two times a day (BID) | CUTANEOUS | Status: DC

## 2012-04-04 NOTE — Progress Notes (Signed)
  Subjective:    Patient ID: Cole Morgan, male    DOB: October 17, 1940, 72 y.o.   MRN: [id number]  HPI Here for 2 weeks of mild dizziness which hits when he moves his head suddenly. This goes away after a few seconds. No HA or pain. He has run out of his allergy sprays. Also he has trouble sleeping and wants to try Trazadone for this. His friend had good results on this. Finally he has had a recurrent sore spot on the scrotum for several years. No blisters have been seen.    Review of Systems  Constitutional: Negative.   HENT: Positive for congestion and postnasal drip. Negative for hearing loss and ear pain.   Neurological: Positive for dizziness. Negative for tremors, seizures, syncope, facial asymmetry, speech difficulty, weakness, light-headedness, numbness and headaches.       Objective:   Physical Exam  Constitutional: He is oriented to person, place, and time. He appears well-developed and well-nourished.  HENT:  Right Ear: External ear normal.  Left Ear: External ear normal.  Nose: Nose normal.  Mouth/Throat: Oropharynx is clear and moist.  Eyes: Conjunctivae normal are normal.  Genitourinary:       1 cm area of macular erythema on the scrotum   Lymphadenopathy:    He has no cervical adenopathy.  Neurological: He is alert and oriented to person, place, and time. He has normal reflexes. No cranial nerve deficit. He exhibits normal muscle tone. Coordination normal.          Assessment & Plan:  Try Lotrisone on the scrotum. Try Trazadone at bedtime. Get back on allergy meds and he was given a steroid shot for the vertigo.

## 2012-04-04 NOTE — Addendum Note (Signed)
Addended by: Aniceto Boss A on: 04/04/2012 03:49 PM   Modules accepted: Orders

## 2012-04-06 ENCOUNTER — Telehealth: Payer: Self-pay | Admitting: Family Medicine

## 2012-04-06 NOTE — Telephone Encounter (Signed)
Patient Information:  Caller Name: Leonette Most  Phone: [phone]  Patient: Cole Morgan  Gender: Male  DOB: 1941-02-03  Age: 72 Years  PCP: Gershon Crane [facility])  Office Follow Up:  Does the office need to follow up with this patient?: Yes  Instructions For The Office: Please review.  Patient requesting Rx to be called in  RN Note:  Declines OV, wanting Rx called into [facility]  Symptoms  Reason For Call & Symptoms: Dizziness, feeling unstable started 3 weeks ago.  Seen in office 04/04/2012 and given shot that helped a lot.  Felt better that day but now Symptoms have returned.  Dizziness when up or moving about, feeling unstable but okay if sitting resting.  Reviewed Health History In EMR: Yes  Reviewed Medications In EMR: Yes  Reviewed Allergies In EMR: Yes  Reviewed Surgeries / Procedures: Yes  Date of Onset of Symptoms: 03/16/2012  Guideline(s) Used:  Dizziness  Disposition Per Guideline:   Go to Office Now  Reason For Disposition Reached:   Lightheadedness (dizziness) present now, after 2 hours of rest and fluids  Advice Given:  N/A  Patient Refused Recommendation:  Patient Requests Prescription  Declines OV since in 2 days ago.  Wanting Rx called to [facility].

## 2012-04-07 MED ORDER — MECLIZINE HCL 25 MG PO TABS: 25.0000 mg | ORAL_TABLET | ORAL | Status: DC | PRN

## 2012-04-07 NOTE — Telephone Encounter (Signed)
Call in Meclizine 25 mg q 4 hours prn dizziness, #60 with 5 rf 

## 2012-04-07 NOTE — Telephone Encounter (Signed)
I sent script e-scribe and left voice message for pt 

## 2012-04-10 ENCOUNTER — Ambulatory Visit (INDEPENDENT_AMBULATORY_CARE_PROVIDER_SITE_OTHER): Payer: Medicare Other

## 2012-04-10 ENCOUNTER — Ambulatory Visit: Payer: Medicare Other

## 2012-04-10 DIAGNOSIS — J309 Allergic rhinitis, unspecified: Secondary | ICD-10-CM

## 2012-04-17 ENCOUNTER — Ambulatory Visit: Payer: Medicare Other

## 2012-04-17 ENCOUNTER — Ambulatory Visit (INDEPENDENT_AMBULATORY_CARE_PROVIDER_SITE_OTHER): Payer: Medicare Other

## 2012-04-17 DIAGNOSIS — J309 Allergic rhinitis, unspecified: Secondary | ICD-10-CM

## 2012-04-24 ENCOUNTER — Ambulatory Visit (INDEPENDENT_AMBULATORY_CARE_PROVIDER_SITE_OTHER): Payer: Medicare Other

## 2012-04-24 ENCOUNTER — Ambulatory Visit: Payer: Medicare Other

## 2012-04-24 DIAGNOSIS — J309 Allergic rhinitis, unspecified: Secondary | ICD-10-CM

## 2012-05-01 ENCOUNTER — Ambulatory Visit: Payer: Medicare Other

## 2012-05-02 ENCOUNTER — Ambulatory Visit (INDEPENDENT_AMBULATORY_CARE_PROVIDER_SITE_OTHER): Payer: Medicare Other

## 2012-05-02 DIAGNOSIS — J309 Allergic rhinitis, unspecified: Secondary | ICD-10-CM

## 2012-05-08 ENCOUNTER — Encounter: Payer: Self-pay | Admitting: Internal Medicine

## 2012-05-08 ENCOUNTER — Ambulatory Visit (INDEPENDENT_AMBULATORY_CARE_PROVIDER_SITE_OTHER): Payer: Medicare Other

## 2012-05-08 ENCOUNTER — Ambulatory Visit: Payer: Medicare Other

## 2012-05-08 DIAGNOSIS — J309 Allergic rhinitis, unspecified: Secondary | ICD-10-CM

## 2012-05-15 ENCOUNTER — Ambulatory Visit (INDEPENDENT_AMBULATORY_CARE_PROVIDER_SITE_OTHER): Payer: Medicare Other

## 2012-05-15 ENCOUNTER — Ambulatory Visit: Payer: Medicare Other

## 2012-05-15 DIAGNOSIS — J309 Allergic rhinitis, unspecified: Secondary | ICD-10-CM

## 2012-05-22 ENCOUNTER — Ambulatory Visit (INDEPENDENT_AMBULATORY_CARE_PROVIDER_SITE_OTHER): Payer: Medicare Other

## 2012-05-22 DIAGNOSIS — J309 Allergic rhinitis, unspecified: Secondary | ICD-10-CM

## 2012-05-29 ENCOUNTER — Ambulatory Visit (INDEPENDENT_AMBULATORY_CARE_PROVIDER_SITE_OTHER): Payer: Medicare Other

## 2012-05-29 DIAGNOSIS — J309 Allergic rhinitis, unspecified: Secondary | ICD-10-CM

## 2012-06-05 ENCOUNTER — Ambulatory Visit (INDEPENDENT_AMBULATORY_CARE_PROVIDER_SITE_OTHER): Payer: Medicare Other

## 2012-06-05 DIAGNOSIS — J309 Allergic rhinitis, unspecified: Secondary | ICD-10-CM

## 2012-06-06 ENCOUNTER — Ambulatory Visit: Payer: Medicare Other | Admitting: Internal Medicine

## 2012-06-07 ENCOUNTER — Ambulatory Visit (INDEPENDENT_AMBULATORY_CARE_PROVIDER_SITE_OTHER): Payer: Medicare Other | Admitting: Family Medicine

## 2012-06-07 ENCOUNTER — Encounter: Payer: Self-pay | Admitting: Family Medicine

## 2012-06-07 VITALS — BP 120/80 | HR 87 | Temp 98.5°F | Wt 230.0 lb

## 2012-06-07 DIAGNOSIS — E119 Type 2 diabetes mellitus without complications: Secondary | ICD-10-CM

## 2012-06-07 DIAGNOSIS — J209 Acute bronchitis, unspecified: Secondary | ICD-10-CM

## 2012-06-07 LAB — HEPATIC FUNCTION PANEL
ALT: 33 U/L (ref 0–53)
AST: 22 U/L (ref 0–37)
Alkaline Phosphatase: 37 U/L — ABNORMAL LOW (ref 39–117)
Bilirubin, Direct: 0 mg/dL (ref 0.0–0.3)
Total Protein: 7.8 g/dL (ref 6.0–8.3)

## 2012-06-07 LAB — POCT URINALYSIS DIPSTICK
Ketones, UA: NEGATIVE
Protein, UA: NEGATIVE
Spec Grav, UA: 1.02
Urobilinogen, UA: 0.2
pH, UA: 5.5

## 2012-06-07 LAB — CBC WITH DIFFERENTIAL/PLATELET
Basophils Absolute: 0 10*3/uL (ref 0.0–0.1)
HCT: 43.8 % (ref 39.0–52.0)
Hemoglobin: 14.8 g/dL (ref 13.0–17.0)
Lymphs Abs: 0.7 10*3/uL (ref 0.7–4.0)
MCV: 92.7 fl (ref 78.0–100.0)
Monocytes Absolute: 0.6 10*3/uL (ref 0.1–1.0)
Neutro Abs: 7.1 10*3/uL (ref 1.4–7.7)
Platelets: 273 10*3/uL (ref 150.0–400.0)
RDW: 14 % (ref 11.5–14.6)

## 2012-06-07 LAB — HEMOGLOBIN A1C: Hgb A1c MFr Bld: 6.3 % (ref 4.6–6.5)

## 2012-06-07 LAB — BASIC METABOLIC PANEL
CO2: 27 mEq/L (ref 19–32)
Chloride: 99 mEq/L (ref 96–112)
Creatinine, Ser: 0.8 mg/dL (ref 0.4–1.5)
Potassium: 4 mEq/L (ref 3.5–5.1)

## 2012-06-07 MED ORDER — ALBUTEROL SULFATE (2.5 MG/3ML) 0.083% IN NEBU: 2.5000 mg | INHALATION_SOLUTION | RESPIRATORY_TRACT | Status: DC | PRN

## 2012-06-07 MED ORDER — LEVOFLOXACIN 500 MG PO TABS: 500.0000 mg | ORAL_TABLET | Freq: Every day | ORAL | Status: AC

## 2012-06-07 MED ORDER — METHYLPREDNISOLONE ACETATE 80 MG/ML IJ SUSP
160.0000 mg | Freq: Once | INTRAMUSCULAR | Status: AC
  Administered 2012-06-07: 160 mg via INTRAMUSCULAR

## 2012-06-07 NOTE — Progress Notes (Signed)
  Subjective:    Patient ID: Cole Morgan, male    DOB: [birth year], 72 y.o.   MRN: [id number]  HPI Here for one week of chest tightness, coughing up green sputum, PND, and sinus pressure. No fever. He has a nebulizer at home but has run out of albuterol.    Review of Systems  Constitutional: Negative.   HENT: Positive for congestion, postnasal drip and sinus pressure.   Eyes: Negative.   Respiratory: Positive for cough, chest tightness, shortness of breath and wheezing.        Objective:   Physical Exam  Constitutional: He appears well-developed and well-nourished.  Coughing a lot   HENT:  Right Ear: External ear normal.  Left Ear: External ear normal.  Nose: Nose normal.  Mouth/Throat: Oropharynx is clear and moist.  Eyes: Conjunctivae are normal.  Pulmonary/Chest: Effort normal. No respiratory distress. He has no rales.  Scattered wheezes and rhonchi   Lymphadenopathy:    He has no cervical adenopathy.          Assessment & Plan:  Treated with a steroid shot and Levaquin. Refilled albuterol for his nebulizer. Get labs

## 2012-06-07 NOTE — Addendum Note (Signed)
Addended by: Aniceto Boss A on: 06/07/2012 01:01 PM   Modules accepted: Orders

## 2012-06-08 NOTE — Progress Notes (Signed)
Quick Note:  I put a copy of labs in mail to pt. ______

## 2012-06-12 ENCOUNTER — Ambulatory Visit (INDEPENDENT_AMBULATORY_CARE_PROVIDER_SITE_OTHER): Payer: Medicare Other

## 2012-06-12 DIAGNOSIS — J309 Allergic rhinitis, unspecified: Secondary | ICD-10-CM

## 2012-06-14 ENCOUNTER — Telehealth: Payer: Self-pay | Admitting: Internal Medicine

## 2012-06-14 NOTE — Telephone Encounter (Signed)
Merge mold vial C into vial A

## 2012-06-14 NOTE — Telephone Encounter (Signed)
I gave Mr.Carillo his shot this wk. He is ready for you to add mold to his current vac.Marland Kitchen He's on 1:50 at 0.5.(on all three vials) He has 9 southern grass,plantain,ragweed in vial a and 8 e tree,mite d.far.,mite d.pteron.,dog in vial b. Please let me know which vial you want mold in.

## 2012-06-16 ENCOUNTER — Ambulatory Visit (INDEPENDENT_AMBULATORY_CARE_PROVIDER_SITE_OTHER): Payer: Medicare Other

## 2012-06-16 DIAGNOSIS — J309 Allergic rhinitis, unspecified: Secondary | ICD-10-CM

## 2012-06-19 ENCOUNTER — Ambulatory Visit (INDEPENDENT_AMBULATORY_CARE_PROVIDER_SITE_OTHER): Payer: Medicare Other

## 2012-06-19 DIAGNOSIS — J309 Allergic rhinitis, unspecified: Secondary | ICD-10-CM

## 2012-06-26 ENCOUNTER — Ambulatory Visit (INDEPENDENT_AMBULATORY_CARE_PROVIDER_SITE_OTHER): Payer: Medicare Other

## 2012-06-26 DIAGNOSIS — J309 Allergic rhinitis, unspecified: Secondary | ICD-10-CM

## 2012-07-10 ENCOUNTER — Ambulatory Visit (INDEPENDENT_AMBULATORY_CARE_PROVIDER_SITE_OTHER): Payer: Medicare Other

## 2012-07-10 DIAGNOSIS — J309 Allergic rhinitis, unspecified: Secondary | ICD-10-CM

## 2012-07-17 ENCOUNTER — Ambulatory Visit (INDEPENDENT_AMBULATORY_CARE_PROVIDER_SITE_OTHER): Payer: Medicare Other

## 2012-07-17 DIAGNOSIS — J309 Allergic rhinitis, unspecified: Secondary | ICD-10-CM

## 2012-07-19 ENCOUNTER — Ambulatory Visit: Payer: Medicare Other | Admitting: Internal Medicine

## 2012-07-20 ENCOUNTER — Ambulatory Visit (INDEPENDENT_AMBULATORY_CARE_PROVIDER_SITE_OTHER): Payer: Medicare Other | Admitting: Internal Medicine

## 2012-07-20 ENCOUNTER — Encounter: Payer: Self-pay | Admitting: Internal Medicine

## 2012-07-20 VITALS — BP 126/72 | HR 88 | Ht 73.0 in | Wt 229.2 lb

## 2012-07-20 DIAGNOSIS — J302 Other seasonal allergic rhinitis: Secondary | ICD-10-CM

## 2012-07-20 DIAGNOSIS — J309 Allergic rhinitis, unspecified: Secondary | ICD-10-CM

## 2012-07-20 MED ORDER — TRAMADOL HCL 50 MG PO TABS: 100.0000 mg | ORAL_TABLET | Freq: Four times a day (QID) | ORAL | Status: DC | PRN

## 2012-07-20 MED ORDER — KETOTIFEN FUMARATE 0.025 % OP SOLN: 2.0000 [drp] | Freq: Two times a day (BID) | OPHTHALMIC | Status: DC

## 2012-07-20 NOTE — Patient Instructions (Addendum)
Script for tramadol for pain- use sparingly. It may help the headaches at night  Script for Zaditor- anti-allergy eye drop. Try it regularly for awhile  Ask pharmacist for otc eye lubricant like Lacrilube. Use this at bedtime, when it can work during the night to soothe dry, irritated eyes.   We can continue allergy vaccine. When you next order, we will increase the strength of your shots up to 1:10.

## 2012-07-20 NOTE — Progress Notes (Signed)
Patient ID: Jerilee HohCharley S Hirschmann, male    DOB: Oct 17, 1940, 72 y.o.   MRN: [id number]  HPI 72 yo former smoker with hx of allergic rhinitis, skin test positive. Hx obstructive sleep apnea (AHI 15.4/hr)  intolerant of CPAP. Hx of multiple traumas, mostly from horse-riding injuries. I last saw him 2006. He wants reassessment of his allergy status. Blames allergy for making him sleepy. Describes bronchitis each winter, which he attributes to allergy rather than colds- with productive cough, head and chest congestion. [facility] with nasal and chest congestion treated with antibiotics, prednisone taper, nose spray. He has been much better in the past month. Today he notes productive cough, white phlegm. He expects to treat this as an acute event, after which he expects to stay well through the summer. Denies fever, purulent mucus, blood, nodes, sneeze.  03/17/11- 72 yo former smoker with hx of allergic rhinitis, allergic conjunctivitis, skin test positive, recurrent bronchitis. Hx obstructive sleep apnea (AHI 15.4/hr)  intolerant of CPAP. Hx of multiple traumas, mostly from horse-riding injuries. He wants reassessment of his allergy status. Complains of burning itch in throat when he tries to sleep, burning, itching and watering of eyes and nose on waking in AM. Says he has tried multiple otc allergy meds and eye drops w/o benefit.  Allergy Testing-especially positive for house dust but also for numerous grass, weed and tree pollens.  05/17/11- 72 yo former smoker with hx of allergic rhinitis, allergic conjunctivitis, skin test positive, recurrent bronchitis. Hx obstructive sleep apnea (AHI 15.4/hr)  intolerant of CPAP. Hx of multiple traumas, mostly from horse-riding injuries. Increased chest congestion and irritation of eyes and nose with itching and nasal drainage mostly in the last 5 days. He has been outdoors a lot. Much cough but not wheezing. He is building allergy vaccine without  problems.  08/17/11- 89 yo former smoker with hx of allergic rhinitis, allergic conjunctivitis, skin test positive, recurrent bronchitis. Hx obstructive sleep apnea (AHI 15.4/hr)  intolerant of CPAP. Hx of multiple traumas, mostly from horse-riding injuries. Still on vaccine; unsure if allergy injections are really helping. 10 out of 12 months still has increased troubles. 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 We reviewed his skin test results from January. His throat itches some days when he goes into the barn. He did not react to horse but he reacted strongly to grass and less strongly to a number of molds. 10 months of the year he says his eyes itch. He has a history of allergic conjunctivitis when exposed to colds. Somatic pains associated with all of his many orthopedic injuries keep him awake at night.  12/07/11- 72 yo former smoker with hx of allergic rhinitis, allergic conjunctivitis, skin test positive, recurrent bronchitis. Hx obstructive sleep apnea (AHI 15.4/hr)  intolerant of CPAP. Hx of multiple traumas, mostly from horse-riding injuries. Had flu vaccine Still on allergy vaccine (A 1:50, B 1:500) GH,  and ran out of Nasonex; having flare ups since running out. 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 Statins have been causing significant myalgias and he feels better since they were stopped.  07/20/12- 72 yo former smoker with hx of allergic rhinitis, allergic conjunctivitis, skin test positive, recurrent bronchitis. Hx obstructive sleep apnea (AHI 15.4/hr)  intolerant of CPAP. Hx of multiple traumas, mostly from horse-riding injuries. FOLLOWS FOR: still on vaccine and doing well. Denies any flare ups with allergies He continues allergy vaccine 1:50 GH and says he  has done quite well through the spring pollen season. He does still complain of burning and watering eyes. Eye doctor had given him medications which don't help enough. He also blames allergies for his  occipital headaches. I told Mrs. unlikely  Review of Systems-See HPI Constitutional:   No-   weight loss, night sweats, fevers, chills, fatigue, lassitude. HEENT: +headaches, no-difficulty swallowing, tooth/dental problems, sore throat,       +  sneezing, itching, ear ache, nasal congestion, post nasal drip,  CV:  No-   chest pain, orthopnea, PND, swelling in lower extremities, anasarca, dizziness, palpitations Resp: No- acute  shortness of breath with exertion or at rest.              No-   productive cough,  + non-productive cough,  No- coughing up of blood.              No-   change in color of mucus.  No- wheezing.   Skin: No-   rash or lesions. GI:  No-   heartburn, indigestion, abdominal pain, nausea, vomiting,  GU: MS:  +   joint pain or swelling.  Neuro-     nothing unusual Psych:  No- change in mood or affect. No depression or anxiety.  No memory loss.   Objective:   Physical Exam General- Alert, Oriented, Affect-appropriate, Distress- none acute Skin- rash-none, lesions- none, excoriation- none. Very dry skin w/ some eczema in ears. Lymphadenopathy- none Head- atraumatic            Eyes- Gross vision intact, PERRLA, +conjunctivae red            Ears- canals red, excoriated, question fluid L>R            Nose- + pale mucosa.  no-Septal dev, mucus, polyps, erosion, perforation             Throat- Mallampati II , mucosa clear , drainage- none, tonsils- atrophic Neck- flexible , trachea midline, no stridor , thyroid nl, carotid no bruit Chest - symmetrical excursion , unlabored           Heart/CV- RRR , no murmur , no gallop  , no rub, nl s1 s2                           - JVD- none , edema- none, stasis changes- none, varices- none           Lung- clear to P&A, wheeze- none, cough- none , dullness-none, rub- none           Chest wall- surgical scars Abd-  Br/ Gen/ Rectal- Not done, not indicated Extrem- cyanosis- none, clubbing, none, atrophy- none, strength- nl Neuro-  grossly intact to observation

## 2012-07-24 ENCOUNTER — Ambulatory Visit (INDEPENDENT_AMBULATORY_CARE_PROVIDER_SITE_OTHER): Payer: Medicare Other

## 2012-07-24 DIAGNOSIS — J309 Allergic rhinitis, unspecified: Secondary | ICD-10-CM

## 2012-07-31 ENCOUNTER — Encounter: Payer: Self-pay | Admitting: Internal Medicine

## 2012-07-31 ENCOUNTER — Ambulatory Visit (INDEPENDENT_AMBULATORY_CARE_PROVIDER_SITE_OTHER): Payer: Medicare Other

## 2012-07-31 DIAGNOSIS — J309 Allergic rhinitis, unspecified: Secondary | ICD-10-CM

## 2012-08-01 NOTE — Assessment & Plan Note (Signed)
Allergic rhinitis and conjunctivitis. Plan- lacrilube, Zaditor, lacrilubem increase allergy vaccine to 1:10 with next order

## 2012-08-09 ENCOUNTER — Ambulatory Visit (INDEPENDENT_AMBULATORY_CARE_PROVIDER_SITE_OTHER): Payer: Medicare Other

## 2012-08-09 DIAGNOSIS — J309 Allergic rhinitis, unspecified: Secondary | ICD-10-CM

## 2012-08-14 ENCOUNTER — Ambulatory Visit: Payer: Medicare Other

## 2012-08-15 ENCOUNTER — Ambulatory Visit (INDEPENDENT_AMBULATORY_CARE_PROVIDER_SITE_OTHER): Payer: Medicare Other

## 2012-08-15 DIAGNOSIS — J309 Allergic rhinitis, unspecified: Secondary | ICD-10-CM

## 2012-08-21 ENCOUNTER — Ambulatory Visit: Payer: Medicare Other

## 2012-08-22 ENCOUNTER — Ambulatory Visit (INDEPENDENT_AMBULATORY_CARE_PROVIDER_SITE_OTHER): Payer: Medicare Other

## 2012-08-22 DIAGNOSIS — J309 Allergic rhinitis, unspecified: Secondary | ICD-10-CM

## 2012-08-29 ENCOUNTER — Ambulatory Visit (INDEPENDENT_AMBULATORY_CARE_PROVIDER_SITE_OTHER): Payer: Medicare Other

## 2012-08-29 DIAGNOSIS — J309 Allergic rhinitis, unspecified: Secondary | ICD-10-CM

## 2012-09-04 ENCOUNTER — Ambulatory Visit: Payer: Medicare Other

## 2012-09-06 ENCOUNTER — Ambulatory Visit (INDEPENDENT_AMBULATORY_CARE_PROVIDER_SITE_OTHER): Payer: Medicare Other

## 2012-09-06 DIAGNOSIS — J309 Allergic rhinitis, unspecified: Secondary | ICD-10-CM

## 2012-09-11 ENCOUNTER — Ambulatory Visit: Payer: Medicare Other

## 2012-09-12 ENCOUNTER — Ambulatory Visit: Payer: Medicare Other

## 2012-09-18 ENCOUNTER — Ambulatory Visit (INDEPENDENT_AMBULATORY_CARE_PROVIDER_SITE_OTHER): Payer: Medicare Other

## 2012-09-18 DIAGNOSIS — J309 Allergic rhinitis, unspecified: Secondary | ICD-10-CM

## 2012-09-25 ENCOUNTER — Ambulatory Visit (INDEPENDENT_AMBULATORY_CARE_PROVIDER_SITE_OTHER): Payer: Medicare Other

## 2012-09-25 DIAGNOSIS — J309 Allergic rhinitis, unspecified: Secondary | ICD-10-CM

## 2012-09-26 ENCOUNTER — Encounter: Payer: Self-pay | Admitting: Gastroenterology

## 2012-10-02 ENCOUNTER — Telehealth: Payer: Self-pay | Admitting: Endocrinology

## 2012-10-02 ENCOUNTER — Ambulatory Visit (INDEPENDENT_AMBULATORY_CARE_PROVIDER_SITE_OTHER): Payer: Medicare Other

## 2012-10-02 DIAGNOSIS — J309 Allergic rhinitis, unspecified: Secondary | ICD-10-CM

## 2012-10-02 MED ORDER — SITAGLIPTIN PHOSPHATE 100 MG PO TABS: 100.0000 mg | ORAL_TABLET | Freq: Every day | ORAL | Status: DC

## 2012-10-02 NOTE — Telephone Encounter (Signed)
rx sent for Januvia

## 2012-10-09 ENCOUNTER — Ambulatory Visit (INDEPENDENT_AMBULATORY_CARE_PROVIDER_SITE_OTHER): Payer: Medicare Other

## 2012-10-09 DIAGNOSIS — J309 Allergic rhinitis, unspecified: Secondary | ICD-10-CM

## 2012-10-16 ENCOUNTER — Ambulatory Visit (INDEPENDENT_AMBULATORY_CARE_PROVIDER_SITE_OTHER): Payer: Medicare Other

## 2012-10-16 DIAGNOSIS — J309 Allergic rhinitis, unspecified: Secondary | ICD-10-CM

## 2012-10-23 ENCOUNTER — Ambulatory Visit: Payer: Medicare Other

## 2012-10-24 ENCOUNTER — Ambulatory Visit (INDEPENDENT_AMBULATORY_CARE_PROVIDER_SITE_OTHER): Payer: Medicare Other

## 2012-10-24 DIAGNOSIS — J309 Allergic rhinitis, unspecified: Secondary | ICD-10-CM

## 2012-10-30 ENCOUNTER — Ambulatory Visit (INDEPENDENT_AMBULATORY_CARE_PROVIDER_SITE_OTHER): Payer: Medicare Other

## 2012-10-30 DIAGNOSIS — J309 Allergic rhinitis, unspecified: Secondary | ICD-10-CM

## 2012-10-31 ENCOUNTER — Ambulatory Visit: Payer: Medicare Other

## 2012-11-06 ENCOUNTER — Ambulatory Visit (INDEPENDENT_AMBULATORY_CARE_PROVIDER_SITE_OTHER): Payer: Medicare Other

## 2012-11-06 DIAGNOSIS — J309 Allergic rhinitis, unspecified: Secondary | ICD-10-CM

## 2012-11-14 ENCOUNTER — Ambulatory Visit (INDEPENDENT_AMBULATORY_CARE_PROVIDER_SITE_OTHER): Payer: Medicare Other | Admitting: Endocrinology

## 2012-11-14 ENCOUNTER — Ambulatory Visit (INDEPENDENT_AMBULATORY_CARE_PROVIDER_SITE_OTHER): Payer: Medicare Other

## 2012-11-14 ENCOUNTER — Encounter: Payer: Self-pay | Admitting: Endocrinology

## 2012-11-14 VITALS — BP 124/78 | HR 69 | Temp 98.1°F | Resp 12 | Ht 73.0 in | Wt 231.2 lb

## 2012-11-14 DIAGNOSIS — E559 Vitamin D deficiency, unspecified: Secondary | ICD-10-CM

## 2012-11-14 DIAGNOSIS — E785 Hyperlipidemia, unspecified: Secondary | ICD-10-CM

## 2012-11-14 DIAGNOSIS — J309 Allergic rhinitis, unspecified: Secondary | ICD-10-CM

## 2012-11-14 DIAGNOSIS — E119 Type 2 diabetes mellitus without complications: Secondary | ICD-10-CM

## 2012-11-14 DIAGNOSIS — I10 Essential (primary) hypertension: Secondary | ICD-10-CM

## 2012-11-14 LAB — URINALYSIS, ROUTINE W REFLEX MICROSCOPIC
Bilirubin Urine: NEGATIVE
Total Protein, Urine: NEGATIVE
Urine Glucose: NEGATIVE
Urobilinogen, UA: 0.2 (ref 0.0–1.0)

## 2012-11-14 LAB — COMPREHENSIVE METABOLIC PANEL
AST: 21 U/L (ref 0–37)
Albumin: 4 g/dL (ref 3.5–5.2)
Alkaline Phosphatase: 35 U/L — ABNORMAL LOW (ref 39–117)
BUN: 16 mg/dL (ref 6–23)
Calcium: 9.2 mg/dL (ref 8.4–10.5)
Chloride: 100 mEq/L (ref 96–112)
Creatinine, Ser: 0.8 mg/dL (ref 0.4–1.5)
Glucose, Bld: 124 mg/dL — ABNORMAL HIGH (ref 70–99)
Potassium: 4 mEq/L (ref 3.5–5.1)

## 2012-11-14 LAB — MICROALBUMIN / CREATININE URINE RATIO
Creatinine,U: 129.4 mg/dL
Microalb, Ur: 1 mg/dL (ref 0.0–1.9)

## 2012-11-14 LAB — LIPID PANEL
Total CHOL/HDL Ratio: 5
Triglycerides: 129 mg/dL (ref 0.0–149.0)

## 2012-11-14 LAB — HEMOGLOBIN A1C: Hgb A1c MFr Bld: 6.5 % (ref 4.6–6.5)

## 2012-11-14 NOTE — Progress Notes (Addendum)
Patient ID: Cole Morgan, male   DOB: [birth year], 72 y.o.   MRN: [id number]  Cole Morgan is an 72 y.o. male.   Reason for Appointment: Diabetes follow-up   History of Present Illness   Diagnosis: Type 2 DIABETES MELITUS, date of diagnosis:  2000  He has usually been able to get glucose well controlled with oral regimen of Actoplusmet, Januvia and low-dose Amaryl  Takes Amaryl in the evening Usually checking blood sugar before meals only and sometimes after supper with a generic monitor    Despite being active he has difficulty losing weight, weight has gone up a couple pounds     Side effects from medications: None Proper timing of medications in relation to meals: Yes.         Monitors blood glucose: Once a day.    Glucometer: One Touch.          Blood Glucose readings from home diary: readings before breakfast: 107-127; acs 130; pc 150+   Hypoglycemia frequency: Never.          Meals: 3 meals per day.          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  Always use your most recent med list.               ACTOPLUS MET 15-850 MG per tablet  Generic drug:  pioglitazone-metformin  Take 1 tablet by mouth daily.     albuterol (2.5 MG/3ML) 0.083% nebulizer solution  Commonly known as:  PROVENTIL  Take 3 mLs (2.5 mg total) by nebulization every 4 (four) hours as needed for wheezing or shortness of breath.     Azelastine-Fluticasone 137-50 MCG/ACT Susp  Commonly known as:  DYMISTA  Place 2 sprays into both nostrils at bedtime.     ergocalciferol 50000 UNITS capsule  Commonly known as:  VITAMIN D2  Take 50,000 Units by mouth once a week.     fosinopril 10 MG tablet   Commonly known as:  MONOPRIL  Take 10 mg by mouth daily.     glimepiride 1 MG tablet  Commonly known as:  AMARYL  Take 1 mg by mouth daily before breakfast.     ketotifen 0.025 % ophthalmic solution  Commonly known as:  ZADITOR  Place 2 drops into both eyes 2 (two) times daily.     mometasone 50 MCG/ACT nasal spray  Commonly known as:  NASONEX  Place 2 sprays into the nose daily.     sitaGLIPtin 100 MG tablet  Commonly known as:  JANUVIA  Take 1 tablet (100 mg total) by mouth daily.     traMADol 50 MG tablet  Commonly known as:  ULTRAM  Take 2 tablets (100 mg total) by mouth every 6 (six) hours as needed for pain.     traZODone 50 MG tablet  Commonly known as:  DESYREL  Take 1 tablet (50 mg total) by mouth at bedtime.     WELCHOL 3.75 G Pack  Generic drug:  Colesevelam HCl  Take 3.75 g by mouth.  Allergies:  Allergies  Allergen Reactions  . Crestor [Rosuvastatin Calcium]     myalgias    Past Medical History  Diagnosis Date  . Sleep apnea     seesd Dr. Jetty Duhamel, intolerant of CPAP  . ALLERGIC RHINITIS     sees Dr. Jetty Duhamel  . Type II or unspecified type diabetes mellitus without mention of complication, not stated as uncontrolled     sees Dr. Reather Littler  . Hypertension   . Hyperlipidemia     sees Dr. Reather Littler  . Chronic low back pain   . Asthma     Past Surgical History  Procedure Laterality Date  . Back surgery    . Knee surgery    . Shoulder surgery      multiple sugeries, injured riding horses  . Colonoscopy  11-29-07    per Dr. Jarold Motto, polyps, repeat in 3 yrs     Family History  Problem Relation Age of Onset  . Coronary artery disease    . Diabetes    . Asthma    . Allergies      Social History:  reports that he quit smoking about 27 years ago. His smoking use included Cigarettes. He has a 15 pack-year smoking history. He has never used smokeless tobacco. He reports that he does not drink alcohol or use illicit  drugs.  Review of Systems:  HYPERTENSION:  has been usually well controlled with monotherapy using Monopril, checking occasionally at home   HYPERLIPIDEMIA: The lipid abnormality consists of elevated LDL, previously has had reports of muscle cramps with statin drugs including lovastatin and pravastatin. He admits to being irregular with Welchol, last LDL was 114.    Insomnia, chronic, asking for a sleeping medication  History of mild vitamin D deficiency, the lowest level was about 25, on supplements now   Examination:   BP 124/78  Pulse 69  Temp(Src) 98.1 F (36.7 C)  Resp 12  Ht 6\' 1"  (1.854 m)  Wt 231 lb 3.2 oz (104.872 kg)  BMI 30.51 kg/m2  SpO2 97%  Body mass index is 30.51 kg/(m^2).   ASSESSMENT/ PLAN::   Diabetes type 2   The patient's diabetes control appears to be still overall well controlled with good readings at home and no hypoglycemia with low-dose Amaryl He will check more readings after meals and continue to increase activity  Lipids to be checked: Discussed taking WelChol regularly for long-term benefits, should improve his diabetes control also    Nicholos Aloisi 11/14/2012, 8:19 AM

## 2012-11-14 NOTE — Patient Instructions (Addendum)
Please check blood sugars at least half the time about 2 hours after any meal and as directed on waking up.  Please bring blood sugar diary to each visit  Take WelChol daily

## 2012-11-20 ENCOUNTER — Ambulatory Visit: Payer: Medicare Other

## 2012-11-20 ENCOUNTER — Telehealth: Payer: Self-pay | Admitting: *Deleted

## 2012-11-20 ENCOUNTER — Encounter: Payer: Self-pay | Admitting: *Deleted

## 2012-11-20 LAB — HM DIABETES EYE EXAM

## 2012-11-20 NOTE — Telephone Encounter (Signed)
Message copied by Hermenia Bers on Mon Nov 20, 2012  9:03 AM ------      Message from: Reather Littler      Created: Thu Nov 16, 2012  8:22 AM       Sugar control good, cholesterol needs to be lower by 20 mg, needs to take WelChol regularly ------

## 2012-11-20 NOTE — Telephone Encounter (Signed)
No phone # on file, letter mailed

## 2012-11-21 ENCOUNTER — Ambulatory Visit (INDEPENDENT_AMBULATORY_CARE_PROVIDER_SITE_OTHER): Payer: Medicare Other

## 2012-11-21 DIAGNOSIS — J309 Allergic rhinitis, unspecified: Secondary | ICD-10-CM

## 2012-11-27 ENCOUNTER — Ambulatory Visit (INDEPENDENT_AMBULATORY_CARE_PROVIDER_SITE_OTHER): Payer: Medicare Other

## 2012-11-27 DIAGNOSIS — J309 Allergic rhinitis, unspecified: Secondary | ICD-10-CM

## 2012-12-04 ENCOUNTER — Ambulatory Visit (INDEPENDENT_AMBULATORY_CARE_PROVIDER_SITE_OTHER): Payer: Medicare Other

## 2012-12-04 DIAGNOSIS — J309 Allergic rhinitis, unspecified: Secondary | ICD-10-CM

## 2012-12-05 ENCOUNTER — Encounter: Payer: Self-pay | Admitting: Family Medicine

## 2012-12-06 ENCOUNTER — Other Ambulatory Visit: Payer: Self-pay | Admitting: *Deleted

## 2012-12-06 MED ORDER — PIOGLITAZONE HCL-METFORMIN HCL 15-850 MG PO TABS: 1.0000 | ORAL_TABLET | Freq: Every day | ORAL | Status: DC

## 2012-12-08 ENCOUNTER — Encounter: Payer: Self-pay | Admitting: Endocrinology

## 2012-12-11 ENCOUNTER — Ambulatory Visit: Payer: Medicare Other

## 2012-12-11 ENCOUNTER — Other Ambulatory Visit: Payer: Self-pay | Admitting: *Deleted

## 2012-12-18 ENCOUNTER — Ambulatory Visit (INDEPENDENT_AMBULATORY_CARE_PROVIDER_SITE_OTHER): Payer: Medicare Other

## 2012-12-18 DIAGNOSIS — J309 Allergic rhinitis, unspecified: Secondary | ICD-10-CM

## 2012-12-25 ENCOUNTER — Ambulatory Visit: Payer: Medicare Other

## 2013-01-01 ENCOUNTER — Ambulatory Visit (INDEPENDENT_AMBULATORY_CARE_PROVIDER_SITE_OTHER): Payer: Medicare Other

## 2013-01-01 DIAGNOSIS — J309 Allergic rhinitis, unspecified: Secondary | ICD-10-CM

## 2013-01-04 ENCOUNTER — Telehealth: Payer: Self-pay | Admitting: Endocrinology

## 2013-01-04 ENCOUNTER — Other Ambulatory Visit: Payer: Self-pay | Admitting: *Deleted

## 2013-01-04 MED ORDER — GLIMEPIRIDE 1 MG PO TABS: 1.0000 mg | ORAL_TABLET | Freq: Every day | ORAL | Status: DC

## 2013-01-08 ENCOUNTER — Ambulatory Visit: Payer: Medicare Other

## 2013-01-15 NOTE — Telephone Encounter (Signed)
rx was faxed to [facility] on 10/23, pt notified.  Rx was verbally called in to [facility] today

## 2013-01-22 ENCOUNTER — Other Ambulatory Visit: Payer: Self-pay | Admitting: *Deleted

## 2013-01-22 ENCOUNTER — Ambulatory Visit (INDEPENDENT_AMBULATORY_CARE_PROVIDER_SITE_OTHER): Payer: Medicare Other

## 2013-01-22 DIAGNOSIS — J309 Allergic rhinitis, unspecified: Secondary | ICD-10-CM

## 2013-01-22 MED ORDER — GLIMEPIRIDE 1 MG PO TABS: 1.0000 mg | ORAL_TABLET | Freq: Every day | ORAL | Status: DC

## 2013-01-29 ENCOUNTER — Ambulatory Visit: Payer: Medicare Other

## 2013-01-30 ENCOUNTER — Ambulatory Visit (INDEPENDENT_AMBULATORY_CARE_PROVIDER_SITE_OTHER): Payer: Medicare Other

## 2013-01-30 DIAGNOSIS — J309 Allergic rhinitis, unspecified: Secondary | ICD-10-CM

## 2013-02-05 ENCOUNTER — Ambulatory Visit (INDEPENDENT_AMBULATORY_CARE_PROVIDER_SITE_OTHER): Payer: Medicare Other

## 2013-02-05 ENCOUNTER — Other Ambulatory Visit: Payer: Self-pay | Admitting: *Deleted

## 2013-02-05 ENCOUNTER — Telehealth: Payer: Self-pay | Admitting: *Deleted

## 2013-02-05 ENCOUNTER — Telehealth: Payer: Self-pay | Admitting: Endocrinology

## 2013-02-05 DIAGNOSIS — J309 Allergic rhinitis, unspecified: Secondary | ICD-10-CM

## 2013-02-05 MED ORDER — PIOGLITAZONE HCL-METFORMIN HCL 15-850 MG PO TABS: 1.0000 | ORAL_TABLET | Freq: Two times a day (BID) | ORAL | Status: DC

## 2013-02-05 NOTE — Telephone Encounter (Signed)
Twice a day

## 2013-02-05 NOTE — Telephone Encounter (Signed)
Pt needs new Rx for Accu Plus  He states he has been doing twice daily, pharm says once daily He only has two days supply, he also wants correct instruction on use  Call back [phone] Pharmacy: [facility]   Thank You :)

## 2013-02-05 NOTE — Telephone Encounter (Signed)
Patient called, he wants to know if he's suppose to take his actoplusmet once a day or twice a day? Please advise

## 2013-02-12 ENCOUNTER — Ambulatory Visit (INDEPENDENT_AMBULATORY_CARE_PROVIDER_SITE_OTHER): Payer: Medicare Other

## 2013-02-12 DIAGNOSIS — J309 Allergic rhinitis, unspecified: Secondary | ICD-10-CM

## 2013-02-13 ENCOUNTER — Ambulatory Visit: Payer: Medicare Other | Admitting: Endocrinology

## 2013-02-13 ENCOUNTER — Encounter: Payer: Self-pay | Admitting: Internal Medicine

## 2013-02-14 ENCOUNTER — Encounter: Payer: Self-pay | Admitting: Internal Medicine

## 2013-02-19 ENCOUNTER — Other Ambulatory Visit: Payer: Self-pay | Admitting: *Deleted

## 2013-02-19 ENCOUNTER — Ambulatory Visit (INDEPENDENT_AMBULATORY_CARE_PROVIDER_SITE_OTHER): Payer: Medicare Other

## 2013-02-19 DIAGNOSIS — J309 Allergic rhinitis, unspecified: Secondary | ICD-10-CM

## 2013-02-26 ENCOUNTER — Ambulatory Visit (INDEPENDENT_AMBULATORY_CARE_PROVIDER_SITE_OTHER): Payer: Medicare Other

## 2013-02-26 DIAGNOSIS — J309 Allergic rhinitis, unspecified: Secondary | ICD-10-CM

## 2013-03-05 ENCOUNTER — Ambulatory Visit (INDEPENDENT_AMBULATORY_CARE_PROVIDER_SITE_OTHER): Payer: Medicare Other

## 2013-03-05 DIAGNOSIS — J309 Allergic rhinitis, unspecified: Secondary | ICD-10-CM

## 2013-03-12 ENCOUNTER — Other Ambulatory Visit (INDEPENDENT_AMBULATORY_CARE_PROVIDER_SITE_OTHER): Payer: Medicare Other

## 2013-03-12 ENCOUNTER — Ambulatory Visit (INDEPENDENT_AMBULATORY_CARE_PROVIDER_SITE_OTHER): Payer: Medicare Other

## 2013-03-12 DIAGNOSIS — E785 Hyperlipidemia, unspecified: Secondary | ICD-10-CM

## 2013-03-12 DIAGNOSIS — E119 Type 2 diabetes mellitus without complications: Secondary | ICD-10-CM

## 2013-03-12 DIAGNOSIS — J309 Allergic rhinitis, unspecified: Secondary | ICD-10-CM

## 2013-03-12 LAB — COMPREHENSIVE METABOLIC PANEL
AST: 30 U/L (ref 0–37)
Albumin: 4 g/dL (ref 3.5–5.2)
Alkaline Phosphatase: 34 U/L — ABNORMAL LOW (ref 39–117)
BUN: 13 mg/dL (ref 6–23)
CO2: 23 mEq/L (ref 19–32)
GFR: 94.16 mL/min (ref >60.00)
Potassium: 4.3 mEq/L (ref 3.5–5.1)
Sodium: 138 mEq/L (ref 135–145)
Total Bilirubin: 0.7 mg/dL (ref 0.3–1.2)
Total Protein: 7.3 g/dL (ref 6.0–8.3)

## 2013-03-12 LAB — URINALYSIS, ROUTINE W REFLEX MICROSCOPIC
Bilirubin Urine: NEGATIVE
Hgb urine dipstick: NEGATIVE
Leukocytes, UA: NEGATIVE
Nitrite: NEGATIVE
RBC / HPF: NONE SEEN (ref 0–?)
Specific Gravity, Urine: 1.02 (ref 1.000–1.030)
Total Protein, Urine: NEGATIVE
WBC, UA: NONE SEEN (ref 0–?)
pH: 5.5 (ref 5.0–8.0)

## 2013-03-12 LAB — LIPID PANEL
HDL: 31.6 mg/dL — ABNORMAL LOW (ref >39.00)
LDL Cholesterol: 129 mg/dL — ABNORMAL HIGH (ref 0–99)
VLDL: 36 mg/dL (ref 0.0–40.0)

## 2013-03-12 LAB — MICROALBUMIN / CREATININE URINE RATIO: Microalb, Ur: 0.4 mg/dL (ref 0.0–1.9)

## 2013-03-19 ENCOUNTER — Ambulatory Visit (INDEPENDENT_AMBULATORY_CARE_PROVIDER_SITE_OTHER): Payer: Medicare Other | Admitting: Endocrinology

## 2013-03-19 ENCOUNTER — Ambulatory Visit (INDEPENDENT_AMBULATORY_CARE_PROVIDER_SITE_OTHER): Payer: Medicare Other

## 2013-03-19 ENCOUNTER — Other Ambulatory Visit: Payer: Self-pay | Admitting: *Deleted

## 2013-03-19 ENCOUNTER — Encounter: Payer: Self-pay | Admitting: Endocrinology

## 2013-03-19 VITALS — BP 118/70 | HR 74 | Temp 98.3°F | Resp 12 | Ht 73.0 in | Wt 243.6 lb

## 2013-03-19 DIAGNOSIS — Z9889 Other specified postprocedural states: Secondary | ICD-10-CM

## 2013-03-19 DIAGNOSIS — G4733 Obstructive sleep apnea (adult) (pediatric): Secondary | ICD-10-CM

## 2013-03-19 DIAGNOSIS — Z860100 Personal history of colon polyps, unspecified: Secondary | ICD-10-CM

## 2013-03-19 DIAGNOSIS — Z23 Encounter for immunization: Secondary | ICD-10-CM

## 2013-03-19 DIAGNOSIS — J309 Allergic rhinitis, unspecified: Secondary | ICD-10-CM

## 2013-03-19 DIAGNOSIS — R5383 Other fatigue: Secondary | ICD-10-CM

## 2013-03-19 DIAGNOSIS — I10 Essential (primary) hypertension: Secondary | ICD-10-CM

## 2013-03-19 DIAGNOSIS — IMO0001 Reserved for inherently not codable concepts without codable children: Secondary | ICD-10-CM

## 2013-03-19 DIAGNOSIS — R5381 Other malaise: Secondary | ICD-10-CM

## 2013-03-19 DIAGNOSIS — E785 Hyperlipidemia, unspecified: Secondary | ICD-10-CM

## 2013-03-19 DIAGNOSIS — E1165 Type 2 diabetes mellitus with hyperglycemia: Principal | ICD-10-CM

## 2013-03-19 MED ORDER — PIOGLITAZONE HCL-METFORMIN HCL 15-850 MG PO TABS: 1.0000 | ORAL_TABLET | Freq: Two times a day (BID) | ORAL | Status: DC

## 2013-03-19 NOTE — Patient Instructions (Addendum)
Glimeperide 2mg  in pms daily  Watch diet, increase activity as tolerated  Welchol 1 pkt daily

## 2013-03-19 NOTE — Progress Notes (Signed)
Patient ID: Cole Morgan, male   DOB: 24-Oct-1940, 73 y.o.   MRN: [id number]  Cole Morgan is an 73 y.o. male.   Reason for Appointment: Diabetes follow-up   History of Present Illness   Diagnosis: Type 2 DIABETES MELITUS, date of diagnosis:  2000  He has usually been able to get his diabetes well controlled with oral regimen of Actoplusmet, Januvia and low-dose Amaryl  Takes Amaryl in the evening In early 2014 HbgA1c was reported as 6.2 Usually checking blood sugar before meals only and sometimes after He says that his blood sugars have been higher since he has had multiple orthopedic problems and not clear if he had any steroids for this. Also his prescription for Actoplusmet ran out and he was only getting one a day until recently HEMOGLOBIN A1c is higher than usual from multiple factors. Also has not been taking his WelChol recently Again he has difficulty losing weight, weight has gone significantly on this visit      Side effects from medications: None Proper timing of medications in relation to meals: Yes.         Monitors blood glucose: Once a day.    Glucometer: One Touch.          Blood Glucose readings from home diary: readings before breakfast: 131-147, pm 87-170  Hypoglycemia frequency: Never.          Meals: 3 meals per day.          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 He has not been followed up for his sleep apnea. Apparently could not tolerate CPAP.  Also thinks that he get sleepy because of allergies      Medication List       This list is accurate as of: 03/19/13  8:08 AM.  Always use your most recent med list.               Azelastine-Fluticasone 137-50 MCG/ACT Susp  Commonly known as:  DYMISTA  Place 2 sprays into both nostrils at bedtime.     ergocalciferol 50000 UNITS capsule  Commonly known as:  VITAMIN D2  Take 50,000 Units by mouth once a week.     fosinopril 10 MG tablet  Commonly known as:  MONOPRIL  Take 10 mg by mouth daily.     glimepiride 1 MG tablet  Commonly known as:  AMARYL  Take 1 tablet (1 mg total) by mouth daily before breakfast.     ketotifen 0.025 % ophthalmic solution  Commonly known as:  ZADITOR  Place 2 drops into both eyes 2 (two) times daily.     mometasone 50 MCG/ACT nasal spray  Commonly known as:  NASONEX  Place 2 sprays into the nose daily.     pioglitazone-metformin [id number]  MG per tablet  Commonly known as:  ACTOPLUS MET  Take 1 tablet by mouth 2 (two) times daily with a meal.     sitaGLIPtin 100 MG tablet  Commonly known as:  JANUVIA  Take 1 tablet (100 mg total) by mouth daily.     traMADol 50 MG tablet  Commonly known as:  ULTRAM  Take 2 tablets (100 mg total) by mouth every 6 (six) hours as needed for pain.     WELCHOL 3.75 G Pack  Generic drug:  Colesevelam HCl  Take 3.75 g by mouth.        Allergies:  Allergies  Allergen Reactions  . Crestor [Rosuvastatin Calcium]     myalgias    Past Medical History  Diagnosis Date  . Sleep apnea     seesd Dr. Jetty Duhamel, intolerant of CPAP  . ALLERGIC RHINITIS     sees Dr. Jetty Duhamel  . Type II or unspecified type diabetes mellitus without mention of complication, not stated as uncontrolled     sees Dr. Reather Littler  . Hypertension   . Hyperlipidemia     sees Dr. Reather Littler  . Chronic low back pain   . Asthma      Past Surgical History  Procedure Laterality Date  . Back surgery    . Knee surgery    . Shoulder surgery      multiple sugeries, injured riding horses  . Colonoscopy  11-29-07    per Dr. Jarold Motto, polyps, repeat in 3 yrs     Family History  Problem Relation Age of Onset  . Coronary artery disease    . Diabetes    . Asthma    . Allergies      Social History:  reports that he quit smoking about 28 years ago. His smoking use included Cigarettes. He has a 15 pack-year smoking history. He has never used smokeless tobacco. He reports that he does not drink alcohol or use illicit drugs.  Review of Systems:  HYPERTENSION:  has been usually well controlled with monotherapy using Monopril,  checking periodically at home with good readings   HYPERLIPIDEMIA: The lipid abnormality consists of elevated LDL, previously has had  complained  of muscle cramps with statin drugs including lovastatin and pravastatin. He admits to being irregular with Welchol because of taking other medications for his joint problems LDL has increased     Insomnia, chronic, uses a  sleeping medication  History of mild vitamin D deficiency, the lowest level was about 25, on supplements now   He apparently fractured his foot recently  LABS:  No visits with results within 1 Week(s) from this visit. Latest known visit with results is:  Appointment on 03/12/2013  Component Date Value Range Status  . Cholesterol 03/12/2013 197  0 - 200 mg/dL Final   ATP III Classification       Desirable:  < 200 mg/dL               Borderline High:  200 - 239 mg/dL          High:  > = 161 mg/dL  . Triglycerides 03/12/2013 180.0* 0.0 - 149.0 mg/dL Final   Normal:  <096 mg/dLBorderline High:  150 - 199 mg/dL  . HDL 03/12/2013 31.60* >39.00 mg/dL Final  . VLDL 04/54/0981 36.0  0.0 - 40.0 mg/dL Final  . LDL Cholesterol 03/12/2013 129* 0 - 99 mg/dL Final  . Total CHOL/HDL Ratio 03/12/2013 6   Final  Men           Women1/2 Average Risk     3.4          3.3Average Risk          5.0          4.42X Average Risk          9.6          7.13X Average Risk          15.0          11.0                      . Sodium 03/12/2013 138  135 - 145 mEq/L Final  . Potassium 03/12/2013 4.3  3.5 - 5.1 mEq/L Final  . Chloride 03/12/2013 104  96 - 112 mEq/L Final  . CO2 03/12/2013 23  19 - 32 mEq/L Final  . Glucose, Bld 03/12/2013 132* 70 - 99 mg/dL Final  . BUN 78/29/5621 13  6 - 23 mg/dL Final  . Creatinine, Ser 03/12/2013 0.9  0.4 - 1.5 mg/dL Final  . Total Bilirubin 03/12/2013 0.7  0.3 - 1.2 mg/dL Final  . Alkaline Phosphatase 03/12/2013 34* 39 - 117 U/L Final  . AST 03/12/2013 30  0 - 37 U/L Final  . ALT 03/12/2013 44  0 - 53 U/L Final  . Total Protein 03/12/2013 7.3  6.0 - 8.3 g/dL Final  . Albumin 30/86/5784 4.0  3.5 - 5.2 g/dL Final  . Calcium 69/62/9528 9.2  8.4 - 10.5 mg/dL Final  . GFR 41/32/4401 94.16  >60.00 mL/min Final  . Hemoglobin A1C 03/12/2013 6.8* 4.6 - 6.5 % Final   Glycemic Control Guidelines for People with Diabetes:Non Diabetic:  <6%Goal of Therapy: <7%Additional Action Suggested:  >8%   . Color, Urine 03/12/2013 YELLOW  Yellow;Lt. Yellow Final  . APPearance 03/12/2013 CLEAR  Clear Final  . Specific Gravity, Urine 03/12/2013 1.020  1.000-1.030 Final  . pH 03/12/2013 5.5  5.0 - 8.0 Final  . Total Protein, Urine 03/12/2013 NEGATIVE  Negative Final  . Urine Glucose 03/12/2013 NEGATIVE  Negative Final  . Ketones, ur 03/12/2013 NEGATIVE  Negative Final  . Bilirubin Urine 03/12/2013 NEGATIVE  Negative Final  . Hgb urine dipstick 03/12/2013 NEGATIVE  Negative Final  . Urobilinogen, UA 03/12/2013 0.2  0.0 - 1.0 Final  . Leukocytes, UA 03/12/2013 NEGATIVE  Negative Final  . Nitrite 03/12/2013 NEGATIVE  Negative Final  . WBC, UA 03/12/2013 none seen  0-2/hpf Final  . RBC / HPF 03/12/2013 none seen  0-2/hpf Final  . Squamous Epithelial / LPF 03/12/2013 Rare(0-4/hpf)  Rare(0-4/hpf) Final  . Microalb, Ur  03/12/2013 0.4  0.0 - 1.9 mg/dL Final  . Creatinine,U 02/72/5366 67.7   Final  . Microalb Creat Ratio 03/12/2013 0.6  0.0 - 30.0 mg/g Final     Examination:   BP 118/70  Pulse 74  Temp(Src) 98.3 F (36.8 C)  Resp 12  Ht 6\' 1"  (1.854 m)  Wt 243 lb 9.6 oz (110.496 kg)  BMI 32.15 kg/m2  SpO2 95%  Body mass index is 32.15 kg/(m^2).    no pedal edema  ASSESSMENT/ PLAN:   1. Diabetes type 2   The patient's diabetes control appears to be  somewhat worse with relatively higher A1c and fasting readings at home as discussed in history of present illness He needs to overall work harder on his diet and try to get back into physical activity as  tolerated Also temporarily can increase his Amaryl to 2 mg in the evening since fasting readings are still high in the morning Advised him to restart his WelChol Discussed blood sugar targets He will check more readings after meals and bring his monitor for download on the next visit   2. Lipids: Discussed taking WelChol regularly for  hypercholesterolemia;  should improve his diabetes control also. Not clear if he is willing to try another statin drug and he will not like to use a nonpreferred drug like Livalo  3. Hypertension: Well controlled, continue same medications. No proteinuria.  4. Sleep apnea: He is symptomatic with daytime somnolence and will followup with Dr. a.m.     5. Due for his colonoscopy, he willF/u Dr Jarold Motto; apparently had a small polyp in 2009  6. Vitamin D deficiency: Continue supplements  Counseling time over 50% of today's 25 minute visit  Brooklynn Brandenburg 03/19/2013, 8:08 AM

## 2013-03-26 ENCOUNTER — Ambulatory Visit (INDEPENDENT_AMBULATORY_CARE_PROVIDER_SITE_OTHER): Payer: Medicare Other

## 2013-03-26 DIAGNOSIS — J309 Allergic rhinitis, unspecified: Secondary | ICD-10-CM

## 2013-04-02 ENCOUNTER — Ambulatory Visit (INDEPENDENT_AMBULATORY_CARE_PROVIDER_SITE_OTHER): Payer: Medicare Other

## 2013-04-02 DIAGNOSIS — J309 Allergic rhinitis, unspecified: Secondary | ICD-10-CM

## 2013-04-09 ENCOUNTER — Ambulatory Visit (AMBULATORY_SURGERY_CENTER): Payer: Self-pay | Admitting: *Deleted

## 2013-04-09 ENCOUNTER — Ambulatory Visit (INDEPENDENT_AMBULATORY_CARE_PROVIDER_SITE_OTHER): Payer: Medicare Other

## 2013-04-09 VITALS — Ht 73.0 in | Wt 244.0 lb

## 2013-04-09 DIAGNOSIS — J309 Allergic rhinitis, unspecified: Secondary | ICD-10-CM

## 2013-04-09 DIAGNOSIS — Z86010 Personal history of colonic polyps: Secondary | ICD-10-CM

## 2013-04-09 MED ORDER — MOVIPREP 100 G PO SOLR: ORAL | Status: DC

## 2013-04-09 NOTE — Progress Notes (Signed)
Patient denies any allergies to eggs or soy. Patient denies any problems with anesthesia.  

## 2013-04-16 ENCOUNTER — Ambulatory Visit: Payer: Medicare Other

## 2013-04-18 ENCOUNTER — Encounter: Payer: Self-pay | Admitting: Gastroenterology

## 2013-04-18 ENCOUNTER — Ambulatory Visit (AMBULATORY_SURGERY_CENTER): Payer: Medicare Other | Admitting: Gastroenterology

## 2013-04-18 VITALS — BP 131/78 | HR 69 | Temp 96.9°F | Resp 18 | Ht 73.0 in | Wt 244.0 lb

## 2013-04-18 DIAGNOSIS — Z86010 Personal history of colonic polyps: Secondary | ICD-10-CM

## 2013-04-18 DIAGNOSIS — K5730 Diverticulosis of large intestine without perforation or abscess without bleeding: Secondary | ICD-10-CM

## 2013-04-18 LAB — GLUCOSE, CAPILLARY
GLUCOSE-CAPILLARY: 142 mg/dL — AB (ref 70–99)
Glucose-Capillary: 135 mg/dL — ABNORMAL HIGH (ref 70–99)

## 2013-04-18 MED ORDER — SODIUM CHLORIDE 0.9 % IV SOLN: 500.0000 mL | INTRAVENOUS | Status: DC

## 2013-04-18 NOTE — Op Note (Signed)
[facility] [address] [place], [id number]   COLONOSCOPY PROCEDURE REPORT  PATIENT: Cole Morgan, Cole S.  MR#: [id number] BIRTHDATE: [birth year] , 72  yrs. old GENDER: Male ENDOSCOPIST: Mardella Laymanavid R Patterson, MD, [facility] REFERRED BY: PROCEDURE DATE:  04/18/2013 PROCEDURE:   Colonoscopy, surveillance First Screening Colonoscopy - Avg.  risk and is 50 yrs.  old or older - No.  Prior Negative Screening - Now for repeat screening. N/A  History of Adenoma - Now for follow-up colonoscopy & has been > or = to 3 yrs.  Yes hx of adenoma.  Has been 3 or more years since last colonoscopy. ASA CLASS:   Class III INDICATIONS:Patient's personal history of adenomatous colon polyps.  MEDICATIONS: propofol (Diprivan) 300mg  IV  DESCRIPTION OF PROCEDURE:   After the risks benefits and alternatives of the procedure were thoroughly explained, informed consent was obtained.  A digital rectal exam revealed no abnormalities of the rectum.   The LB CF-H180AL Loaner [id number] endoscope was introduced through the anus and advanced to the cecum, which was identified by both the appendix and ileocecal valve. No adverse events experienced.   The quality of the prep was good, using MoviPrep  The instrument was then slowly withdrawn as the colon was fully examined.      COLON FINDINGS: Mild diverticulosis was noted in the descending colon and sigmoid colon.   The colon was otherwise normal.  There was no diverticulosis, inflammation, polyps or cancers unless previously stated.  Retroflexed views revealed no abnormalities. The time to cecum=2 minutes 07 seconds.  Withdrawal time=8 minutes 18 seconds.  The scope was withdrawn and the procedure completed. COMPLICATIONS: There were no complications.  ENDOSCOPIC IMPRESSION: 1.   Mild diverticulosis was noted in the descending colon and sigmoid colon 2.   The colon was otherwise normal ...no polyps noted.  RECOMMENDATIONS: 1.  Continue current  medications 2.  Repeat Colonoscopy in 5 years. 3. High fiber diet  eSigned:  Mardella Laymanavid R Patterson, MD, [facility] 04/18/2013 9:49 AM   cc:   PATIENT NAME:  Cole Morgan, Cole S. MR#: [id number]

## 2013-04-18 NOTE — Patient Instructions (Signed)
YOU HAD AN ENDOSCOPIC PROCEDURE TODAY AT THE  ENDOSCOPY CENTER: Refer to the procedure report that was given to you for any specific questions about what was found during the examination.  If the procedure report does not answer your questions, please call your gastroenterologist to clarify.  If you requested that your care partner not be given the details of your procedure findings, then the procedure report has been included in a sealed envelope for you to review at your convenience later.  YOU SHOULD EXPECT: Some feelings of bloating in the abdomen. Passage of more gas than usual.  Walking can help get rid of the air that was put into your GI tract during the procedure and reduce the bloating. If you had a lower endoscopy (such as a colonoscopy or flexible sigmoidoscopy) you may notice spotting of blood in your stool or on the toilet paper. If you underwent a bowel prep for your procedure, then you may not have a normal bowel movement for a few days.  DIET: Your first meal following the procedure should be a light meal and then it is ok to progress to your normal diet.  A half-sandwich or bowl of soup is an example of a good first meal.  Heavy or fried foods are harder to digest and may make you feel nauseous or bloated.  Likewise meals heavy in dairy and vegetables can cause extra gas to form and this can also increase the bloating.  Drink plenty of fluids but you should avoid alcoholic beverages for 24 hours.  ACTIVITY: Your care partner should take you home directly after the procedure.  You should plan to take it easy, moving slowly for the rest of the day.  You can resume normal activity the day after the procedure however you should NOT DRIVE or use heavy machinery for 24 hours (because of the sedation medicines used during the test).    SYMPTOMS TO REPORT IMMEDIATELY: A gastroenterologist can be reached at any hour.  During normal business hours, 8:30 AM to 5:00 PM Monday through Friday,  call [phone].  After hours and on weekends, please call the GI answering service at [phone] who will take a message and have the physician on call contact you.   Following lower endoscopy (colonoscopy or flexible sigmoidoscopy):  Excessive amounts of blood in the stool  Significant tenderness or worsening of abdominal pains  Swelling of the abdomen that is new, acute  Fever of 100F or higher  FOLLOW UP: If any biopsies were taken you will be contacted by phone or by letter within the next 1-3 weeks.  Call your gastroenterologist if you have not heard about the biopsies in 3 weeks.  Our staff will call the home number listed on your records the next business day following your procedure to check on you and address any questions or concerns that you may have at that time regarding the information given to you following your procedure. This is a courtesy call and so if there is no answer at the home number and we have not heard from you through the emergency physician on call, we will assume that you have returned to your regular daily activities without incident.   Resume medications. Information given on diverticulosis and high fiber diet with discharge instructions. SIGNATURES/CONFIDENTIALITY: You and/or your care partner have signed paperwork which will be entered into your electronic medical record.  These signatures attest to the fact that that the information above on your After Visit Summary  has been reviewed and is understood.  Full responsibility of the confidentiality of this discharge information lies with you and/or your care-partner. 

## 2013-04-19 ENCOUNTER — Telehealth: Payer: Self-pay | Admitting: *Deleted

## 2013-04-19 ENCOUNTER — Ambulatory Visit (INDEPENDENT_AMBULATORY_CARE_PROVIDER_SITE_OTHER): Payer: Medicare Other | Admitting: Internal Medicine

## 2013-04-19 VITALS — BP 124/80 | HR 79 | Ht 73.0 in | Wt 249.0 lb

## 2013-04-19 DIAGNOSIS — J302 Other seasonal allergic rhinitis: Secondary | ICD-10-CM

## 2013-04-19 DIAGNOSIS — G4733 Obstructive sleep apnea (adult) (pediatric): Secondary | ICD-10-CM

## 2013-04-19 DIAGNOSIS — J3089 Other allergic rhinitis: Secondary | ICD-10-CM

## 2013-04-19 DIAGNOSIS — J309 Allergic rhinitis, unspecified: Secondary | ICD-10-CM

## 2013-04-19 MED ORDER — METHYLPREDNISOLONE ACETATE 80 MG/ML IJ SUSP
80.0000 mg | Freq: Once | INTRAMUSCULAR | Status: AC
  Administered 2013-04-19: 80 mg via INTRAMUSCULAR

## 2013-04-19 MED ORDER — PHENYLEPHRINE HCL 1 % NA SOLN
3.0000 [drp] | Freq: Once | NASAL | Status: DC
Start: 2013-04-19 — End: 2013-05-13

## 2013-04-19 MED ORDER — AMOXICILLIN-POT CLAVULANATE 875-125 MG PO TABS: 1.0000 | ORAL_TABLET | Freq: Two times a day (BID) | ORAL | Status: DC

## 2013-04-19 NOTE — Progress Notes (Signed)
Patient ID: Cole Morgan, male    DOB: 07-Sep-1940, 73 y.o.   MRN: [id number]  HPI 73 yo former smoker with hx of allergic rhinitis, skin test positive. Hx obstructive sleep apnea (AHI 15.4/hr)  intolerant of CPAP. Hx of multiple traumas, mostly from horse-riding injuries. I last saw him 2006. He wants reassessment of his allergy status. Blames allergy for making him sleepy. Describes bronchitis each winter, which he attributes to allergy rather than colds- with productive cough, head and chest congestion. [facility] this winter with nasal and chest congestion treated with antibiotics, prednisone taper, nose spray. He has been much better in the past month. Today he notes productive cough, white phlegm. He expects to treat this as an acute event, after which he expects to stay well through the summer. Denies fever, purulent mucus, blood, nodes, sneeze.  03/17/11- 73 yo former smoker with hx of allergic rhinitis, allergic conjunctivitis, skin test positive, recurrent bronchitis. Hx obstructive sleep apnea (AHI 15.4/hr)  intolerant of CPAP. Hx of multiple traumas, mostly from horse-riding injuries. He wants reassessment of his allergy status. Complains of burning itch in throat when he tries to sleep, burning, itching and watering of eyes and nose on waking in AM. Says he has tried multiple otc allergy meds and eye drops w/o benefit.  Allergy Testing-especially positive for house dust but also for numerous grass, weed and tree pollens.  05/17/11- 89 yo former smoker with hx of allergic rhinitis, allergic conjunctivitis, skin test positive, recurrent bronchitis. Hx obstructive sleep apnea (AHI 15.4/hr)  intolerant of CPAP. Hx of multiple traumas, mostly from horse-riding injuries. Increased chest congestion and irritation of eyes and nose with itching and nasal drainage mostly in the last 5 days. He has been outdoors a lot. Much cough but not wheezing. He is building allergy vaccine without  problems.  08/17/11- 73 yo former smoker with hx of allergic rhinitis, allergic conjunctivitis, skin test positive, recurrent bronchitis. Hx obstructive sleep apnea (AHI 15.4/hr)  intolerant of CPAP. Hx of multiple traumas, mostly from horse-riding injuries. Still on vaccine; unsure if allergy injections are really helping. 10 out of 12 months still has increased troubles. 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 We reviewed his skin test results from January. His throat itches some days when he goes into the barn. He did not react to horse but he reacted strongly to grass and less strongly to a number of molds. 10 months of the year he says his eyes itch. He has a history of allergic conjunctivitis when exposed to colds. Somatic pains associated with all of his many orthopedic injuries keep him awake at night.  12/07/11- 73 yo former smoker with hx of allergic rhinitis, allergic conjunctivitis, skin test positive, recurrent bronchitis. Hx obstructive sleep apnea (AHI 15.4/hr)  intolerant of CPAP. Hx of multiple traumas, mostly from horse-riding injuries. Had flu vaccine Still on allergy vaccine (A 1:50, B 1:500) GH,  and ran out of Nasonex; having flare ups since running out. 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 Statins have been causing significant myalgias and he feels better since they were stopped.  07/20/12- 73 yo former smoker with hx of allergic rhinitis, allergic conjunctivitis, skin test positive, recurrent bronchitis. Hx obstructive sleep apnea (AHI 15.4/hr)  intolerant of CPAP. Hx of multiple traumas, mostly from horse-riding injuries. FOLLOWS FOR: still on vaccine and doing well. Denies any flare ups with allergies He continues allergy vaccine 1:50 GH and says he  has done quite well through the spring pollen season. He does still complain of burning and watering eyes. Eye doctor had given him medications which don't help enough. He also blames allergies for his  occipital headaches.  04/19/13-  54 yo former smoker with hx of allergic rhinitis, allergic conjunctivitis, skin test positive, recurrent bronchitis. Hx obstructive sleep apnea (AHI 15.4/hr)  intolerant of CPAP. Hx of multiple traumas, mostly from horse-riding injuries. Dr. Lucianne Muss has referred him back for sleep medicine evaluation. NPSG- 09-17-04- moderate obstructive sleep apnea (AHI 15.4/hr), also Periodic Limb Movement 8.2/ hr. Patient complains of no energy, wants to sleep but can't sleep. He mainly complains of itching, sneezing and itching of the eyes and nose with nasal congestion. Can't sleep because of nasal stuffiness. Palate itches. He gargles with peroxide. He has continued allergy vaccine 1:10 GH Bedtime between 10 PM and 12:30, sleep latency 1-3 hours, waking 3 or 4 times before up between 8 AM and 10 AM. He is retired, living alone with grown family.  ROS-see HPI Constitutional:   No-   weight loss, night sweats, fevers, chills, fatigue, lassitude. HEENT:   No-  headaches, difficulty swallowing, tooth/dental problems, sore throat,       No-  sneezing, itching, ear ache, nasal congestion, post nasal drip,  CV:  No-   chest pain, orthopnea, PND, swelling in lower extremities, anasarca,                                  dizziness, palpitations Resp: No-   shortness of breath with exertion or at rest.              No-   productive cough,  No non-productive cough,  No- coughing up of blood.              No-   change in color of mucus.  No- wheezing.   Skin: No-   rash or lesions. GI:  No-   heartburn, indigestion, abdominal pain, nausea, vomiting, diarrhea,                 change in bowel habits, loss of appetite GU: No-   dysuria, change in color of urine, no urgency or frequency.  No- flank pain. MS:  No-   joint pain or swelling.  No- decreased range of motion.  No- back pain. Neuro-     nothing unusual Psych:  No- change in mood or affect. No depression or anxiety.  No memory  loss.  Review of Systems-See HPI Constitutional:   No-   weight loss, night sweats, fevers, chills, +fatigue, lassitude. HEENT: +headaches, no-difficulty swallowing, tooth/dental problems, sore throat,       +  sneezing, +itching, ear ache, +nasal congestion, post nasal drip,  CV:  No-   chest pain, orthopnea, PND, swelling in lower extremities, anasarca, dizziness, palpitations Resp: No- acute  shortness of breath with exertion or at rest.              No-   productive cough,  + non-productive cough,  No- coughing up of blood.              No-   change in color of mucus.  No- wheezing.   Skin: No-   rash or lesions. GI:  No-   heartburn, indigestion, abdominal pain, nausea, vomiting,  GU: MS:  +   joint pain or swelling.  Neuro-  nothing unusual Psych:  No- change in mood or affect. No depression or anxiety.  No memory loss.   Objective:   Physical Exam General- Alert, Oriented, Affect-appropriate, Distress- none acute Skin- rash-none, lesions- none, excoriation- none. Very dry skin w/ some eczema in ears. Lymphadenopathy- none Head- atraumatic            Eyes- Gross vision intact, PERRLA, +conjunctivae red            Ears- canals red, excoriated, question fluid L>R            Nose- + Turbinate edema.  no-Septal dev, mucus, polyps, erosion, perforation,                        + can't blow air through his nose            Throat- Mallampati II , mucosa clear , drainage- none, tonsils- atrophic Neck- flexible , trachea midline, no stridor , thyroid nl, carotid no bruit Chest - symmetrical excursion , unlabored           Heart/CV- RRR , no murmur , no gallop  , no rub, nl s1 s2                           - JVD- none , edema- none, stasis changes- none, varices- none           Lung- clear to P&A, wheeze- none, cough- none , dullness-none, rub- none           Chest wall- surgical scars Abd-  Br/ Gen/ Rectal- Not done, not indicated Extrem- cyanosis- none, clubbing, none, atrophy-  none, strength- nl Neuro- grossly intact to observation

## 2013-04-19 NOTE — Telephone Encounter (Signed)
  Follow up Call-  Call back number 04/18/2013  Post procedure Call Back phone  # [phone]  Permission to leave phone message Yes     Patient questions:  Do you have a fever, pain , or abdominal swelling? no Pain Score  0 *  Have you tolerated food without any problems? yes  Have you been able to return to your normal activities? yes  Do you have any questions about your discharge instructions: Diet   no Medications  no Follow up visit  no  Do you have questions or concerns about your Care? yes  Actions: * If pain score is 4 or above: No action needed, pain <4.

## 2013-04-19 NOTE — Patient Instructions (Addendum)
Neb nasal neo  Depo 80  Try using otc Sudafed-PE as a decongestant for stuffy nose  Script for antibiotic augmentin to take if you need for your sinuses  Booklet on sleep apnea. The sleep study you had in 2006 did show moderate obstructive sleep apnea, stopping breathing 15 times per hour in your sleep.

## 2013-04-20 ENCOUNTER — Ambulatory Visit (INDEPENDENT_AMBULATORY_CARE_PROVIDER_SITE_OTHER): Payer: Medicare Other

## 2013-04-20 DIAGNOSIS — J309 Allergic rhinitis, unspecified: Secondary | ICD-10-CM

## 2013-04-23 ENCOUNTER — Ambulatory Visit (INDEPENDENT_AMBULATORY_CARE_PROVIDER_SITE_OTHER): Payer: Medicare Other

## 2013-04-23 DIAGNOSIS — J309 Allergic rhinitis, unspecified: Secondary | ICD-10-CM

## 2013-04-30 ENCOUNTER — Ambulatory Visit (INDEPENDENT_AMBULATORY_CARE_PROVIDER_SITE_OTHER): Payer: Medicare Other

## 2013-04-30 DIAGNOSIS — J309 Allergic rhinitis, unspecified: Secondary | ICD-10-CM

## 2013-05-03 ENCOUNTER — Other Ambulatory Visit: Payer: Self-pay | Admitting: *Deleted

## 2013-05-03 MED ORDER — GLIMEPIRIDE 1 MG PO TABS
1.0000 mg | ORAL_TABLET | Freq: Every day | ORAL | Status: DC
Start: 2013-05-03 — End: 2013-07-17

## 2013-05-03 MED ORDER — FOSINOPRIL SODIUM 10 MG PO TABS: 10.0000 mg | ORAL_TABLET | Freq: Every day | ORAL | Status: DC

## 2013-05-03 MED ORDER — ERGOCALCIFEROL 1.25 MG (50000 UT) PO CAPS: 50000.0000 [IU] | ORAL_CAPSULE | ORAL | Status: DC

## 2013-05-07 ENCOUNTER — Ambulatory Visit (INDEPENDENT_AMBULATORY_CARE_PROVIDER_SITE_OTHER): Payer: Medicare Other

## 2013-05-07 DIAGNOSIS — J309 Allergic rhinitis, unspecified: Secondary | ICD-10-CM

## 2013-05-13 ENCOUNTER — Encounter: Payer: Self-pay | Admitting: Internal Medicine

## 2013-05-13 NOTE — Assessment & Plan Note (Signed)
He had failed to tolerate CPAP in the past and his poor nasal airway contributes. We discussed options again including possible oral appliance, although he has had dental repair. Plan-booklet on obstructive sleep apnea to help get him back in the conversation.

## 2013-05-13 NOTE — Assessment & Plan Note (Signed)
There is an allergic component to his rhinitis and he continues allergy vaccine. Plan-nasal nebulizer decongestant, Depo-Medrol. I invited him to accept ENT referral for reevaluation of his nasal airway

## 2013-05-14 ENCOUNTER — Ambulatory Visit (INDEPENDENT_AMBULATORY_CARE_PROVIDER_SITE_OTHER): Payer: Medicare Other

## 2013-05-14 DIAGNOSIS — J309 Allergic rhinitis, unspecified: Secondary | ICD-10-CM

## 2013-05-18 ENCOUNTER — Encounter: Payer: Self-pay | Admitting: Family Medicine

## 2013-05-18 ENCOUNTER — Ambulatory Visit (INDEPENDENT_AMBULATORY_CARE_PROVIDER_SITE_OTHER): Payer: Medicare Other | Admitting: Family Medicine

## 2013-05-18 VITALS — BP 110/64 | HR 85 | Temp 98.2°F | Ht 73.0 in | Wt 244.0 lb

## 2013-05-18 DIAGNOSIS — J019 Acute sinusitis, unspecified: Secondary | ICD-10-CM

## 2013-05-18 DIAGNOSIS — R1013 Epigastric pain: Secondary | ICD-10-CM

## 2013-05-18 MED ORDER — METHYLPREDNISOLONE ACETATE 80 MG/ML IJ SUSP
160.0000 mg | Freq: Once | INTRAMUSCULAR | Status: AC
  Administered 2013-05-18: 160 mg via INTRAMUSCULAR

## 2013-05-18 MED ORDER — ACYCLOVIR 5 % EX OINT: 1.0000 "application " | TOPICAL_OINTMENT | CUTANEOUS | Status: DC

## 2013-05-18 MED ORDER — LEVOFLOXACIN 500 MG PO TABS: 500.0000 mg | ORAL_TABLET | Freq: Every day | ORAL | Status: DC

## 2013-05-18 NOTE — Progress Notes (Signed)
   Subjective:    Patient ID: Cole Morgan, male    DOB: 23-Jun-1940, 73 y.o.   MRN: [id number]  HPI Here for one week of sinus pressure, PND, HA, and blowing green mucus from the nose. Also he mentions frequent epigastric pains that occur shortly after eating meals. No trouble swallowing, no nausea. No heartburn.    Review of Systems  Constitutional: Negative.   HENT: Positive for congestion, postnasal drip and sinus pressure.   Eyes: Negative.   Respiratory: Negative.   Gastrointestinal: Positive for abdominal pain. Negative for nausea, vomiting, diarrhea, constipation, blood in stool, abdominal distention, anal bleeding and rectal pain.       Objective:   Physical Exam  Constitutional: He appears well-developed and well-nourished.  HENT:  Right Ear: External ear normal.  Left Ear: External ear normal.  Nose: Nose normal.  Mouth/Throat: Oropharynx is clear and moist.  Eyes: Conjunctivae are normal.  Cardiovascular: Normal rate, regular rhythm, normal heart sounds and intact distal pulses.   Pulmonary/Chest: Effort normal and breath sounds normal.  Abdominal: Soft. Bowel sounds are normal. He exhibits no distension and no mass. There is no tenderness. There is no rebound and no guarding.  Lymphadenopathy:    He has no cervical adenopathy.          Assessment & Plan:  Treat the sinusitis as below. Evaluate his gall bladder with an US.

## 2013-05-18 NOTE — Progress Notes (Signed)
Pre visit review using our clinic review tool, if applicable. No additional management support is needed unless otherwise documented below in the visit note. 

## 2013-05-21 ENCOUNTER — Telehealth: Payer: Self-pay | Admitting: Family Medicine

## 2013-05-21 ENCOUNTER — Ambulatory Visit: Payer: Medicare Other

## 2013-05-21 NOTE — Telephone Encounter (Signed)
Patient Information:  Caller Name: Vinetta BergamoCharley  Phone: [phone]  Patient: Cole Morgan, Cole Morgan  Gender: Male  DOB: 1940/09/03  Age: 73 Years  PCP: Gershon CraneFry, [facility](Family Practice)  Office Follow Up:  Does the office need to follow up with this patient?: Yes  Instructions For The Office: Patient is requesting a different abx and cough medication.  RN Note:  Home care advice given.  Advised will send message via chart for MD with his medication request.  Symptoms  Reason For Call & Symptoms: Currently taking antibiotic for sinus infection after office visit 05/18/13;  Calling today because he believes the Levaquin is causing him to have severe leg and muscle cramps;  Onset 05/19/13;  Worse at night, not able to sleep;  His muscles get real hard, very painful, comes and goes, hurts to walk;  He is asking for a different antibiotic and a cough medication;  Says cough is not different, not wheezing, no sob or chest pain just non-productive and frequent;  States he meant to ask for cough medicine in office but forgot and he hasn't gotten any better over the wknd.  Reviewed Health History In EMR: Yes  Reviewed Medications In EMR: Yes  Reviewed Allergies In EMR: Yes  Reviewed Surgeries / Procedures: Yes  Date of Onset of Symptoms: 05/19/2013  Guideline(Morgan) Used:  Leg Pain  Disposition Per Guideline:   Home Care  Reason For Disposition Reached:   Caused by transient muscle cramps in the thigh or calf  Advice Given:  Pain Medicines:  For pain relief, you can take either acetaminophen, ibuprofen, or naproxen.  They are over-the-counter (OTC) pain drugs. You can buy them at the drugstore.  Acetaminophen (e.g., Tylenol):  Regular Strength Tylenol: Take 650 mg (two 325 mg pills) by mouth every 4-6 hours as needed. Each Regular Strength Tylenol pill has 325 mg of acetaminophen.  Extra Strength Tylenol: Take 1,000 mg (two 500 mg pills) every 8 hours as needed. Each Extra Strength Tylenol pill has 500  mg of acetaminophen.  The most you should take each day is 3,000 mg (10 Regular Strength or 6 Extra Strength pills a day).  Call Back If:  You become worse.  Patient Will Follow Care Advice:  YES

## 2013-05-22 ENCOUNTER — Ambulatory Visit (INDEPENDENT_AMBULATORY_CARE_PROVIDER_SITE_OTHER): Payer: Medicare Other

## 2013-05-22 DIAGNOSIS — J309 Allergic rhinitis, unspecified: Secondary | ICD-10-CM

## 2013-05-22 MED ORDER — CLARITHROMYCIN 500 MG PO TABS: 500.0000 mg | ORAL_TABLET | Freq: Two times a day (BID) | ORAL | Status: DC

## 2013-05-22 MED ORDER — HYDROCODONE-HOMATROPINE 5-1.5 MG/5ML PO SYRP: 5.0000 mL | ORAL_SOLUTION | ORAL | Status: DC | PRN

## 2013-05-22 NOTE — Telephone Encounter (Signed)
Stop the Levaquin and switch to Biaxin. I printed out a rx for this and for cough syrup

## 2013-05-22 NOTE — Telephone Encounter (Signed)
Scripts are ready for pick up and I left a voice message with below message.

## 2013-05-24 ENCOUNTER — Ambulatory Visit
Admission: RE | Admit: 2013-05-24 | Discharge: 2013-05-24 | Disposition: A | Discharge status: Home / Self Care | Payer: Medicare Other | Source: Ambulatory Visit | Attending: Family Medicine | Admitting: Family Medicine

## 2013-05-24 DIAGNOSIS — R1013 Epigastric pain: Secondary | ICD-10-CM

## 2013-05-28 ENCOUNTER — Ambulatory Visit (INDEPENDENT_AMBULATORY_CARE_PROVIDER_SITE_OTHER): Payer: Medicare Other

## 2013-05-28 DIAGNOSIS — J309 Allergic rhinitis, unspecified: Secondary | ICD-10-CM

## 2013-05-29 ENCOUNTER — Ambulatory Visit: Payer: Medicare Other

## 2013-05-31 ENCOUNTER — Ambulatory Visit (INDEPENDENT_AMBULATORY_CARE_PROVIDER_SITE_OTHER)
Admission: RE | Admit: 2013-05-31 | Discharge: 2013-05-31 | Disposition: A | Discharge status: Home / Self Care | Payer: Medicare Other | Source: Ambulatory Visit | Attending: Internal Medicine | Admitting: Internal Medicine

## 2013-05-31 ENCOUNTER — Encounter: Payer: Self-pay | Admitting: Internal Medicine

## 2013-05-31 ENCOUNTER — Ambulatory Visit (INDEPENDENT_AMBULATORY_CARE_PROVIDER_SITE_OTHER): Payer: Medicare Other | Admitting: Internal Medicine

## 2013-05-31 VITALS — BP 136/70 | HR 72 | Ht 73.0 in | Wt 246.6 lb

## 2013-05-31 DIAGNOSIS — G4733 Obstructive sleep apnea (adult) (pediatric): Secondary | ICD-10-CM

## 2013-05-31 DIAGNOSIS — R05 Cough: Secondary | ICD-10-CM

## 2013-05-31 DIAGNOSIS — J302 Other seasonal allergic rhinitis: Secondary | ICD-10-CM

## 2013-05-31 DIAGNOSIS — J3089 Other allergic rhinitis: Principal | ICD-10-CM

## 2013-05-31 DIAGNOSIS — R059 Cough, unspecified: Secondary | ICD-10-CM

## 2013-05-31 DIAGNOSIS — Z87891 Personal history of nicotine dependence: Secondary | ICD-10-CM

## 2013-05-31 DIAGNOSIS — J309 Allergic rhinitis, unspecified: Secondary | ICD-10-CM

## 2013-05-31 MED ORDER — ACRIVASTINE-PSEUDOEPHEDRINE 8-60 MG PO CAPS: ORAL_CAPSULE | ORAL | Status: DC

## 2013-05-31 MED ORDER — AZELASTINE-FLUTICASONE 137-50 MCG/ACT NA SUSP: 1.0000 | Freq: Every day | NASAL | Status: DC

## 2013-05-31 NOTE — Patient Instructions (Addendum)
Script printed for Dymista nasal spray     1- 2 puffs each nostril once daily at bedtime  Order -CXR- Dx Cough  Script for Semprex-D decongestant antihistamine combination

## 2013-05-31 NOTE — Progress Notes (Signed)
Patient ID: Cole Morgan, male    DOB: Oct 17, 1940, 73 y.o.   MRN: [id number]  HPI 73 yo former smoker with hx of allergic rhinitis, skin test positive. Hx obstructive sleep apnea (AHI 15.4/hr)  intolerant of CPAP. Hx of multiple traumas, mostly from horse-riding injuries. I last saw him 2006. He wants reassessment of his allergy status. Blames allergy for making him sleepy. Describes bronchitis each winter, which he attributes to allergy rather than colds- with productive cough, head and chest congestion. [facility] with nasal and chest congestion treated with antibiotics, prednisone taper, nose spray. He has been much better in the past month. Today he notes productive cough, white phlegm. He expects to treat this as an acute event, after which he expects to stay well through the summer. Denies fever, purulent mucus, blood, nodes, sneeze.  03/17/11- 73 yo former smoker with hx of allergic rhinitis, allergic conjunctivitis, skin test positive, recurrent bronchitis. Hx obstructive sleep apnea (AHI 15.4/hr)  intolerant of CPAP. Hx of multiple traumas, mostly from horse-riding injuries. He wants reassessment of his allergy status. Complains of burning itch in throat when he tries to sleep, burning, itching and watering of eyes and nose on waking in AM. Says he has tried multiple otc allergy meds and eye drops w/o benefit.  Allergy Testing-especially positive for house dust but also for numerous grass, weed and tree pollens.  05/17/11- 73 yo former smoker with hx of allergic rhinitis, allergic conjunctivitis, skin test positive, recurrent bronchitis. Hx obstructive sleep apnea (AHI 15.4/hr)  intolerant of CPAP. Hx of multiple traumas, mostly from horse-riding injuries. Increased chest congestion and irritation of eyes and nose with itching and nasal drainage mostly in the last 5 days. He has been outdoors a lot. Much cough but not wheezing. He is building allergy vaccine without  problems.  08/17/11- 89 yo former smoker with hx of allergic rhinitis, allergic conjunctivitis, skin test positive, recurrent bronchitis. Hx obstructive sleep apnea (AHI 15.4/hr)  intolerant of CPAP. Hx of multiple traumas, mostly from horse-riding injuries. Still on vaccine; unsure if allergy injections are really helping. 10 out of 12 months still has increased troubles. 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 We reviewed his skin test results from January. His throat itches some days when he goes into the barn. He did not react to horse but he reacted strongly to grass and less strongly to a number of molds. 10 months of the year he says his eyes itch. He has a history of allergic conjunctivitis when exposed to colds. Somatic pains associated with all of his many orthopedic injuries keep him awake at night.  12/07/11- 73 yo former smoker with hx of allergic rhinitis, allergic conjunctivitis, skin test positive, recurrent bronchitis. Hx obstructive sleep apnea (AHI 15.4/hr)  intolerant of CPAP. Hx of multiple traumas, mostly from horse-riding injuries. Had flu vaccine Still on allergy vaccine (A 1:50, B 1:500) GH,  and ran out of Nasonex; having flare ups since running out. 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 Statins have been causing significant myalgias and he feels better since they were stopped.  07/20/12- 73 yo former smoker with hx of allergic rhinitis, allergic conjunctivitis, skin test positive, recurrent bronchitis. Hx obstructive sleep apnea (AHI 15.4/hr)  intolerant of CPAP. Hx of multiple traumas, mostly from horse-riding injuries. FOLLOWS FOR: still on vaccine and doing well. Denies any flare ups with allergies He continues allergy vaccine 1:50 GH and says he  has done quite well through the spring pollen season. He does still complain of burning and watering eyes. Eye doctor had given him medications which don't help enough. He also blames allergies for his  occipital headaches.  04/19/13-  73 yo former smoker with hx of allergic rhinitis, allergic conjunctivitis, skin test positive, recurrent bronchitis. Hx obstructive sleep apnea (AHI 15.4/hr)  intolerant of CPAP. Hx of multiple traumas, mostly from horse-riding injuries. Dr. Lucianne MussKumar has referred him back for sleep medicine evaluation. NPSG- 09-17-04- moderate obstructive sleep apnea (AHI 15.4/hr), also Periodic Limb Movement 8.2/ hr. Patient complains of no energy, wants to sleep but can't sleep. He mainly complains of itching, sneezing and itching of the eyes and nose with nasal congestion. Can't sleep because of nasal stuffiness. Palate itches. He gargles with peroxide. He has continued allergy vaccine 1:10 GH Bedtime between 10 PM and 12:30, sleep latency 1-3 hours, waking 3 or 4 times before up between 8 AM and 10 AM. He is retired, living alone with grown family.  05/31/13- 73 yo former smoker with hx of allergic rhinitis, allergic conjunctivitis, skin test positive, recurrent bronchitis. Hx obstructive sleep apnea (AHI 15.4/hr)/  intolerant of CPAP. Hx of multiple traumas, mostly from horse-riding injuries. FOLLOWS FOR: still on allergy vaccine 1:10 GH; states he continues to have trouble with his nasal area.  Complaining again of nasal congestion which is perennial but may be worse now. Dry cough "since I quit chewing tobacco" Gets drowsy sometimes if quiet. We talked again about his experience with CPAP and availability of oral appliances.   ROS-see HPI Constitutional:   No-   weight loss, night sweats, fevers, chills, +fatigue, lassitude. HEENT:   No-  headaches, difficulty swallowing, tooth/dental problems, sore throat,       No-  sneezing, itching, ear ache, +nasal congestion, post nasal drip,  CV:  No-   chest pain, orthopnea, PND, swelling in lower extremities, anasarca,                                  dizziness, palpitations Resp: No-   shortness of breath with exertion or at rest.               No-   productive cough,  No non-productive cough,  No- coughing up of blood.              No-   change in color of mucus.  No- wheezing.   Skin: No-   rash or lesions. GI:  No-   heartburn, indigestion, abdominal pain, nausea, vomiting, GU: . MS:  +joint pain or swelling.  Neuro-     nothing unusual Psych:  No- change in mood or affect. No depression or anxiety.  No memory loss.   Objective:   Physical Exam General- Alert, Oriented, Affect-appropriate, Distress- none acute Skin- rash-none, lesions- none, excoriation- none. Very dry skin w/ some eczema in ears. Lymphadenopathy- none Head- atraumatic            Eyes- Gross vision intact, PERRLA, +conjunctivae red            Ears- canals red, excoriated, question fluid L>R            Nose- + Turbinate edema, + loud sniffing.  no-Septal dev, mucus, polyps, erosion,  No- Perforation, + can't blow air through his nose            Throat- Mallampati III , mucosa clear , drainage- none, tonsils- atrophic Neck- flexible , trachea midline, no stridor , thyroid nl, carotid no bruit Chest - symmetrical excursion , unlabored           Heart/CV- RRR , no murmur , no gallop  , no rub, nl s1 s2                           - JVD- none , edema- none, stasis changes- none, varices- none           Lung- clear to P&A, wheeze- none, cough- none , dullness-none, rub- none           Chest wall- surgical scars Abd-  Br/ Gen/ Rectal- Not done, not indicated Extrem- cyanosis- none, clubbing, none, atrophy- none, strength- nl Neuro- grossly intact to observation

## 2013-06-04 ENCOUNTER — Ambulatory Visit (INDEPENDENT_AMBULATORY_CARE_PROVIDER_SITE_OTHER): Payer: Medicare Other

## 2013-06-04 DIAGNOSIS — J309 Allergic rhinitis, unspecified: Secondary | ICD-10-CM

## 2013-06-08 ENCOUNTER — Encounter: Payer: Self-pay | Admitting: Internal Medicine

## 2013-06-11 ENCOUNTER — Ambulatory Visit (INDEPENDENT_AMBULATORY_CARE_PROVIDER_SITE_OTHER): Payer: Medicare Other

## 2013-06-11 DIAGNOSIS — J309 Allergic rhinitis, unspecified: Secondary | ICD-10-CM

## 2013-06-13 ENCOUNTER — Telehealth: Payer: Self-pay | Admitting: Family Medicine

## 2013-06-13 ENCOUNTER — Encounter: Payer: Self-pay | Admitting: Family Medicine

## 2013-06-13 ENCOUNTER — Ambulatory Visit (INDEPENDENT_AMBULATORY_CARE_PROVIDER_SITE_OTHER): Payer: Medicare Other | Admitting: Family Medicine

## 2013-06-13 VITALS — BP 124/80 | HR 72 | Temp 97.8°F | Ht 72.25 in | Wt 240.0 lb

## 2013-06-13 DIAGNOSIS — Z79899 Other long term (current) drug therapy: Secondary | ICD-10-CM | POA: Diagnosis not present

## 2013-06-13 DIAGNOSIS — I10 Essential (primary) hypertension: Secondary | ICD-10-CM | POA: Diagnosis not present

## 2013-06-13 DIAGNOSIS — E119 Type 2 diabetes mellitus without complications: Secondary | ICD-10-CM

## 2013-06-13 DIAGNOSIS — N400 Enlarged prostate without lower urinary tract symptoms: Secondary | ICD-10-CM

## 2013-06-13 DIAGNOSIS — E785 Hyperlipidemia, unspecified: Secondary | ICD-10-CM

## 2013-06-13 DIAGNOSIS — R252 Cramp and spasm: Secondary | ICD-10-CM | POA: Diagnosis not present

## 2013-06-13 LAB — HEPATIC FUNCTION PANEL
ALK PHOS: 36 U/L — AB (ref 39–117)
ALT: 57 U/L — ABNORMAL HIGH (ref 0–53)
AST: 29 U/L (ref 0–37)
Albumin: 4 g/dL (ref 3.5–5.2)
BILIRUBIN DIRECT: 0.1 mg/dL (ref 0.0–0.3)
BILIRUBIN TOTAL: 0.8 mg/dL (ref 0.3–1.2)
Total Protein: 7.2 g/dL (ref 6.0–8.3)

## 2013-06-13 LAB — BASIC METABOLIC PANEL
BUN: 16 mg/dL (ref 6–23)
CO2: 26 mEq/L (ref 19–32)
Calcium: 9.4 mg/dL (ref 8.4–10.5)
Chloride: 98 mEq/L (ref 96–112)
Creatinine, Ser: 0.8 mg/dL (ref 0.4–1.5)
GFR: 96.72 mL/min (ref >60.00)
Glucose, Bld: 123 mg/dL — ABNORMAL HIGH (ref 70–99)
POTASSIUM: 4.3 meq/L (ref 3.5–5.1)
SODIUM: 133 meq/L — AB (ref 135–145)

## 2013-06-13 LAB — POCT URINALYSIS DIPSTICK
Bilirubin, UA: NEGATIVE
GLUCOSE UA: NEGATIVE
KETONES UA: NEGATIVE
Leukocytes, UA: NEGATIVE
NITRITE UA: NEGATIVE
SPEC GRAV UA: 1.02
Urobilinogen, UA: 0.2
pH, UA: 7.5

## 2013-06-13 LAB — CBC WITH DIFFERENTIAL/PLATELET
BASOS PCT: 0.5 % (ref 0.0–3.0)
Basophils Absolute: 0 10*3/uL (ref 0.0–0.1)
EOS PCT: 4.1 % (ref 0.0–5.0)
Eosinophils Absolute: 0.4 10*3/uL (ref 0.0–0.7)
HEMATOCRIT: 42.6 % (ref 39.0–52.0)
Hemoglobin: 14.5 g/dL (ref 13.0–17.0)
Lymphocytes Relative: 20.7 % (ref 12.0–46.0)
Lymphs Abs: 1.8 10*3/uL (ref 0.7–4.0)
MCHC: 34 g/dL (ref 30.0–36.0)
MCV: 92.3 fl (ref 78.0–100.0)
MONO ABS: 0.8 10*3/uL (ref 0.1–1.0)
Monocytes Relative: 8.7 % (ref 3.0–12.0)
NEUTROS PCT: 66 % (ref 43.0–77.0)
Neutro Abs: 5.9 10*3/uL (ref 1.4–7.7)
Platelets: 237 10*3/uL (ref 150.0–400.0)
RBC: 4.61 Mil/uL (ref 4.22–5.81)
RDW: 13.2 % (ref 11.5–14.6)
WBC: 8.9 10*3/uL (ref 4.5–10.5)

## 2013-06-13 LAB — MAGNESIUM: Magnesium: 1.8 mg/dL (ref 1.5–2.5)

## 2013-06-13 LAB — LIPID PANEL
Cholesterol: 187 mg/dL (ref 0–200)
HDL: 41 mg/dL (ref >39.00)
LDL CALC: 124 mg/dL — AB (ref 0–99)
Total CHOL/HDL Ratio: 5
Triglycerides: 110 mg/dL (ref 0.0–149.0)
VLDL: 22 mg/dL (ref 0.0–40.0)

## 2013-06-13 LAB — VITAMIN B12: Vitamin B-12: 482 pg/mL (ref 211–911)

## 2013-06-13 LAB — TSH: TSH: 1.19 u[IU]/mL (ref 0.35–5.50)

## 2013-06-13 LAB — PSA: PSA: 0.33 ng/mL (ref 0.10–4.00)

## 2013-06-13 MED ORDER — QUININE SULFATE 324 MG PO CAPS: 648.0000 mg | ORAL_CAPSULE | Freq: Every day | ORAL | Status: DC

## 2013-06-13 NOTE — Progress Notes (Signed)
   Subjective:    Patient ID: Cole Morgan, male    DOB: 1940/05/21, 73 y.o.   MRN: [id number]  HPI 73 yr old male for a cpx. His main concern today is nocturnal leg cramps. He has tried taking OTC potassium and magnesium supplements with no effect.    Review of Systems  Constitutional: Negative.   HENT: Negative.   Eyes: Negative.   Respiratory: Negative.   Cardiovascular: Negative.   Gastrointestinal: Negative.   Genitourinary: Negative.   Musculoskeletal: Negative.   Skin: Negative.   Neurological: Negative.   Psychiatric/Behavioral: Negative.        Objective:   Physical Exam  Constitutional: He is oriented to person, place, and time. He appears well-developed and well-nourished. No distress.  HENT:  Head: Normocephalic and atraumatic.  Right Ear: External ear normal.  Left Ear: External ear normal.  Nose: Nose normal.  Mouth/Throat: Oropharynx is clear and moist. No oropharyngeal exudate.  Eyes: Conjunctivae and EOM are normal. Pupils are equal, round, and reactive to light. Right eye exhibits no discharge. Left eye exhibits no discharge. No scleral icterus.  Neck: Neck supple. No JVD present. No tracheal deviation present. No thyromegaly present.  Cardiovascular: Normal rate, regular rhythm, normal heart sounds and intact distal pulses.  Exam reveals no gallop and no friction rub.   No murmur heard. EKG normal   Pulmonary/Chest: Effort normal and breath sounds normal. No respiratory distress. He has no wheezes. He has no rales. He exhibits no tenderness.  Abdominal: Soft. Bowel sounds are normal. He exhibits no distension and no mass. There is no tenderness. There is no rebound and no guarding.  Genitourinary: Rectum normal, prostate normal and penis normal. Guaiac negative stool. No penile tenderness.  Musculoskeletal: Normal range of motion. He exhibits no edema and no tenderness.  Lymphadenopathy:    He has no cervical adenopathy.  Neurological: He is alert and  oriented to person, place, and time. He has normal reflexes. No cranial nerve deficit. He exhibits normal muscle tone. Coordination normal.  Skin: Skin is warm and dry. No rash noted. He is not diaphoretic. No erythema. No pallor.  Psychiatric: He has a normal mood and affect. His behavior is normal. Judgment and thought content normal.          Assessment & Plan:  Get fasting labs. Try Quinine sulfate at bedtime.

## 2013-06-13 NOTE — Telephone Encounter (Signed)
Relevant patient education mailed to patient.  

## 2013-06-13 NOTE — Progress Notes (Signed)
Pre visit review using our clinic review tool, if applicable. No additional management support is needed unless otherwise documented below in the visit note. 

## 2013-06-14 ENCOUNTER — Telehealth: Payer: Self-pay

## 2013-06-14 LAB — VITAMIN D 25 HYDROXY (VIT D DEFICIENCY, FRACTURES): Vit D, 25-Hydroxy: 67 ng/mL (ref 30–89)

## 2013-06-14 NOTE — Telephone Encounter (Signed)
I would like to know if I can use sda slot on Monday 06/25/13. Pt is very adamant about seeing the doctor. Pt also states that he did not pick up the rx for qualaquin said it was to expensive

## 2013-06-18 NOTE — Telephone Encounter (Signed)
Okay to schedule

## 2013-06-19 ENCOUNTER — Ambulatory Visit (INDEPENDENT_AMBULATORY_CARE_PROVIDER_SITE_OTHER): Payer: Medicare Other

## 2013-06-19 DIAGNOSIS — J309 Allergic rhinitis, unspecified: Secondary | ICD-10-CM

## 2013-06-19 NOTE — Telephone Encounter (Signed)
PT HAS BEEN SCHEDULED

## 2013-06-20 DIAGNOSIS — Z87891 Personal history of nicotine dependence: Secondary | ICD-10-CM | POA: Insufficient documentation

## 2013-06-20 NOTE — Assessment & Plan Note (Signed)
Failed CPAP and does not want to try oral appliance

## 2013-06-20 NOTE — Assessment & Plan Note (Signed)
Plan-chest x-ray 

## 2013-06-20 NOTE — Assessment & Plan Note (Signed)
There is also a chronic nonallergic component which may be partly mechanical. He has had a lot of trauma, falling off horses. Plan-Dymista

## 2013-06-25 ENCOUNTER — Ambulatory Visit (INDEPENDENT_AMBULATORY_CARE_PROVIDER_SITE_OTHER): Payer: Medicare Other

## 2013-06-25 DIAGNOSIS — J309 Allergic rhinitis, unspecified: Secondary | ICD-10-CM

## 2013-06-27 ENCOUNTER — Ambulatory Visit (INDEPENDENT_AMBULATORY_CARE_PROVIDER_SITE_OTHER): Payer: Medicare Other | Admitting: Family Medicine

## 2013-06-27 ENCOUNTER — Encounter: Payer: Self-pay | Admitting: Family Medicine

## 2013-06-27 VITALS — BP 130/72 | HR 81 | Temp 98.5°F | Ht 72.25 in | Wt 240.0 lb

## 2013-06-27 DIAGNOSIS — IMO0001 Reserved for inherently not codable concepts without codable children: Secondary | ICD-10-CM

## 2013-06-27 NOTE — Progress Notes (Signed)
   Subjective:    Patient ID: Cole Morgan, male    DOB: 1940-06-05, 73 y.o.   MRN: [id number]  HPI Here to follow up on muscle cramps. We did lab work last time which came back unremarkable. We suggested he take quinine capsules but these were too expensive. 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 He wants to check his levels for these.    Review of Systems  Constitutional: Negative.   Musculoskeletal: Positive for myalgias.       Objective:   Physical Exam  Constitutional: He is oriented to person, place, and time. He appears well-developed and well-nourished.  Musculoskeletal: Normal range of motion. He exhibits no edema and no tenderness.  Neurological: He is alert and oriented to person, place, and time. No cranial nerve deficit. He exhibits normal muscle tone. Coordination normal.          Assessment & Plan:  Check serum levels of these. suggested he drink 8 oz of tonic water each night at bedtime to get some quinine in his system.

## 2013-06-27 NOTE — Progress Notes (Signed)
Pre visit review using our clinic review tool, if applicable. No additional management support is needed unless otherwise documented below in the visit note. 

## 2013-06-29 LAB — SELENIUM SERUM: Selenium, Blood: 150 mcg/L (ref 63–160)

## 2013-06-30 LAB — VITAMIN E
Gamma-Tocopherol (Vit E): 2 mg/L (ref <=4.3)
Vitamin E (Alpha Tocopherol): 10.7 mg/L (ref 5.7–19.9)

## 2013-07-02 ENCOUNTER — Ambulatory Visit (INDEPENDENT_AMBULATORY_CARE_PROVIDER_SITE_OTHER): Payer: Medicare Other

## 2013-07-02 DIAGNOSIS — J309 Allergic rhinitis, unspecified: Secondary | ICD-10-CM

## 2013-07-04 ENCOUNTER — Telehealth: Payer: Self-pay | Admitting: Family Medicine

## 2013-07-04 NOTE — Telephone Encounter (Signed)
Patient would like to know what can be done now for his body cramps. Does he need an appointment?

## 2013-07-05 NOTE — Telephone Encounter (Signed)
No need to see me again. I have no idea what to suggest now. Maybe an Endocrinologist would have some ideas. I can put in a referral to see them if he wishes

## 2013-07-06 ENCOUNTER — Telehealth: Payer: Self-pay

## 2013-07-06 NOTE — Telephone Encounter (Signed)
I left a voice message with the below information. 

## 2013-07-06 NOTE — Telephone Encounter (Signed)
Relevant patient education mailed to patient.  

## 2013-07-09 ENCOUNTER — Ambulatory Visit (INDEPENDENT_AMBULATORY_CARE_PROVIDER_SITE_OTHER): Payer: Medicare Other

## 2013-07-09 DIAGNOSIS — J309 Allergic rhinitis, unspecified: Secondary | ICD-10-CM

## 2013-07-09 NOTE — Telephone Encounter (Signed)
I tried to reach pt again, no answer. I left another voice message advising pt that if he wants the referral, call us back and leave message.

## 2013-07-11 ENCOUNTER — Telehealth: Payer: Self-pay | Admitting: Family Medicine

## 2013-07-11 DIAGNOSIS — M2550 Pain in unspecified joint: Secondary | ICD-10-CM

## 2013-07-11 NOTE — Telephone Encounter (Signed)
Did a referral to Rheumatology

## 2013-07-11 NOTE — Telephone Encounter (Signed)
Pt calling back as directed about cramping in legs, it is not getting any better. Pt stated that at last visit it was decided that if issue did not improve a referral would be placed.  Please advise.

## 2013-07-11 NOTE — Telephone Encounter (Signed)
I left a voice message with the below information. 

## 2013-07-12 ENCOUNTER — Other Ambulatory Visit (INDEPENDENT_AMBULATORY_CARE_PROVIDER_SITE_OTHER): Payer: Medicare Other

## 2013-07-12 DIAGNOSIS — R5381 Other malaise: Secondary | ICD-10-CM

## 2013-07-12 DIAGNOSIS — IMO0001 Reserved for inherently not codable concepts without codable children: Secondary | ICD-10-CM

## 2013-07-12 DIAGNOSIS — R5383 Other fatigue: Secondary | ICD-10-CM

## 2013-07-12 DIAGNOSIS — E1165 Type 2 diabetes mellitus with hyperglycemia: Principal | ICD-10-CM

## 2013-07-12 LAB — COMPREHENSIVE METABOLIC PANEL
ALT: 46 U/L (ref 0–53)
AST: 29 U/L (ref 0–37)
Albumin: 3.5 g/dL (ref 3.5–5.2)
Alkaline Phosphatase: 31 U/L — ABNORMAL LOW (ref 39–117)
BILIRUBIN TOTAL: 0.6 mg/dL (ref 0.3–1.2)
BUN: 18 mg/dL (ref 6–23)
CO2: 27 mEq/L (ref 19–32)
Calcium: 8.9 mg/dL (ref 8.4–10.5)
Chloride: 104 mEq/L (ref 96–112)
Creatinine, Ser: 0.9 mg/dL (ref 0.4–1.5)
GFR: 92.82 mL/min (ref >60.00)
Glucose, Bld: 124 mg/dL — ABNORMAL HIGH (ref 70–99)
Potassium: 4.2 mEq/L (ref 3.5–5.1)
SODIUM: 138 meq/L (ref 135–145)
Total Protein: 6.8 g/dL (ref 6.0–8.3)

## 2013-07-12 LAB — LIPID PANEL
CHOLESTEROL: 178 mg/dL (ref 0–200)
HDL: 31 mg/dL — ABNORMAL LOW (ref >39.00)
LDL Cholesterol: 119 mg/dL — ABNORMAL HIGH (ref 0–99)
Total CHOL/HDL Ratio: 6
Triglycerides: 142 mg/dL (ref 0.0–149.0)
VLDL: 28.4 mg/dL (ref 0.0–40.0)

## 2013-07-12 LAB — TSH: TSH: 1.51 u[IU]/mL (ref 0.35–5.50)

## 2013-07-12 LAB — HEMOGLOBIN A1C: Hgb A1c MFr Bld: 6.6 % — ABNORMAL HIGH (ref 4.6–6.5)

## 2013-07-17 ENCOUNTER — Ambulatory Visit (INDEPENDENT_AMBULATORY_CARE_PROVIDER_SITE_OTHER): Payer: Medicare Other | Admitting: Endocrinology

## 2013-07-17 ENCOUNTER — Encounter: Payer: Self-pay | Admitting: Endocrinology

## 2013-07-17 ENCOUNTER — Other Ambulatory Visit: Payer: Self-pay | Admitting: *Deleted

## 2013-07-17 VITALS — BP 128/82 | HR 75 | Temp 98.3°F | Resp 16 | Ht 73.0 in | Wt 241.6 lb

## 2013-07-17 DIAGNOSIS — E785 Hyperlipidemia, unspecified: Secondary | ICD-10-CM

## 2013-07-17 DIAGNOSIS — R059 Cough, unspecified: Secondary | ICD-10-CM

## 2013-07-17 DIAGNOSIS — I10 Essential (primary) hypertension: Secondary | ICD-10-CM

## 2013-07-17 DIAGNOSIS — R252 Cramp and spasm: Secondary | ICD-10-CM

## 2013-07-17 DIAGNOSIS — E119 Type 2 diabetes mellitus without complications: Secondary | ICD-10-CM

## 2013-07-17 DIAGNOSIS — R05 Cough: Secondary | ICD-10-CM

## 2013-07-17 MED ORDER — IRBESARTAN 150 MG PO TABS: 150.0000 mg | ORAL_TABLET | Freq: Every day | ORAL | Status: DC

## 2013-07-17 NOTE — Patient Instructions (Addendum)
Stop Fosinopril  Stretch calf muscles for 10 min at bedtime

## 2013-07-17 NOTE — Progress Notes (Signed)
Patient ID: Cole Morgan, male   DOB: 12-Nov-1940, 73 y.o.   MRN: [id number]   Reason for Appointment: Diabetes follow-up   History of Present Illness   Diagnosis: Type 2 DIABETES MELITUS, date of diagnosis:  2000  He usually has good control of his diabetes with oral regimen of Actoplusmet, Januvia and low-dose Amaryl  Takes Amaryl in the evening Usually checking blood sugar before meals  and sometimes after evening meal He says that his fasting blood sugars have been higher since early 2015 However his A1c is slightly better than the last time He did not bring his record of blood sugars today Again he has difficulty losing weight, weight has gone significantly on this visit      Side effects from medications: None Proper timing of medications in relation to meals: Yes.         Monitors blood glucose: Once or twice a day.    Glucometer: ?         Blood Glucose readings from home diary: readings before breakfast: 130-140, pm 110; pc 140  Hypoglycemia frequency: Never.          Meals: 3 meals per day.          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 He has not been followed up for his sleep apnea. Apparently could not tolerate CPAP.  Also thinks that he get sleepy because of allergies      Medication List       This list is accurate as of: 07/17/13 10:45 AM.  Always use your most recent med list.               Acrivastine-Pseudoephedrine 8-60 MG Caps  1  cap twice daily for nasal congestion if needed     acyclovir ointment 5 %  Commonly known as:  ZOVIRAX  Apply 1 application topically every 3 (three) hours.     Azelastine-Fluticasone 137-50 MCG/ACT Susp  Commonly known as:  DYMISTA  Place 2 sprays into both nostrils at bedtime.     ergocalciferol 50000 UNITS capsule  Commonly known as:  VITAMIN D2  Take 1 capsule (50,000 Units total) by mouth once a week.     fosinopril 10 MG tablet  Commonly known as:  MONOPRIL  Take 1 tablet (10 mg total) by mouth daily.     glimepiride 1 MG tablet  Commonly known as:  AMARYL  Take 1 mg by mouth at bedtime.     HYDROcodone-homatropine 5-1.5 MG/5ML syrup  Commonly known as:  HYDROMET  Take 5 mLs by mouth every 4 (four) hours as needed for cough.     mometasone 50 MCG/ACT nasal spray  Commonly known as:  NASONEX  Place 2 sprays into the nose daily.     pioglitazone-metformin 15-850 MG per tablet  Commonly known as:  ACTOPLUS MET  Take 1 tablet  by mouth 2 (two) times daily with a meal.     quiNINE 324 MG capsule  Commonly known as:  QUALAQUIN  Take 2 capsules (648 mg total) by mouth at bedtime.     sitaGLIPtin 100 MG tablet  Commonly known as:  JANUVIA  Take 1 tablet (100 mg total) by mouth daily.     traMADol 50 MG tablet  Commonly known as:  ULTRAM  Take 2 tablets (100 mg total) by mouth every 6 (six) hours as needed for pain.     WELCHOL 3.75 G Pack  Generic drug:  Colesevelam HCl  Take 3.75 g by mouth.        Allergies:  Allergies  Allergen Reactions  . Levaquin [Levofloxacin In D5w] Other (See Comments)    Cramps all over body.  [name] [Rosuvastatin Calcium]     myalgias    Past Medical History  Diagnosis Date  . Sleep apnea     seesd Dr. Jetty Duhamel, intolerant of CPAP  . ALLERGIC RHINITIS     sees Dr. Jetty Duhamel  . Type II or unspecified type diabetes mellitus without mention of complication, not stated as uncontrolled     sees Dr. Reather Littler  .  Hypertension   . Hyperlipidemia     sees Dr. Reather Littler  . Chronic low back pain   . Asthma     Past Surgical History  Procedure Laterality Date  . Back surgery    . Knee surgery    . Shoulder surgery      multiple sugeries, injured riding horses  . Colonoscopy  11-29-07    per Dr. Jarold Motto, polyps, repeat in 3 yrs     Family History  Problem Relation Age of Onset  . Coronary artery disease    . Diabetes    . Asthma    . Allergies    . Colon cancer Neg Hx     Social History:  reports that he quit smoking about 28 years ago. His smoking use included Cigarettes. He has a 15 pack-year smoking history. He has never used smokeless tobacco. He reports that he does not drink alcohol or use illicit drugs.  Review of Systems:  Getting leg cramps despite some magnesium/calcium tabs, on 1 daily of each.  He has had extensive evaluation from his primary care physician and could not afford the quinine that was prescribed because of cost. Apparently is going to be referred to rheumatologist  HYPERTENSION:  has been usually well controlled with monotherapy using Monopril, not checking now at home   HYPERLIPIDEMIA: The lipid abnormality consists of elevated LDL, previously has had  complained  of muscle cramps with statin drugs including lovastatin and pravastatin. He is not consistent with Welchol and LDL is still relatively high  Lab Results  Component Value Date   CHOL 178 07/12/2013   HDL 31.00* 07/12/2013   LDLCALC 119* 07/12/2013   TRIG 142.0 07/12/2013   CHOLHDL 6 07/12/2013      Insomnia, chronic, uses a  sleeping medication  History of mild vitamin D deficiency, the lowest level was about 25, on supplements now with good control   Cough, chronic and irritating including in the day and night for the last few months.  LABS:  Appointment on 07/12/2013  Component Date Value Ref Range Status  . Sodium 07/12/2013 138  135 - 145 mEq/L Final  . Potassium 07/12/2013 4.2  3.5 - 5.1  mEq/L Final  . Chloride 07/12/2013 104  96 -  112 mEq/L Final  . CO2 07/12/2013 27  19 - 32 mEq/L Final  . Glucose, Bld 07/12/2013 124* 70 - 99 mg/dL Final  . BUN 81/19/1478 18  6 - 23 mg/dL Final  . Creatinine, Ser 07/12/2013 0.9  0.4 - 1.5 mg/dL Final  . Total Bilirubin 07/12/2013 0.6  0.3 - 1.2 mg/dL Final  . Alkaline Phosphatase 07/12/2013 31* 39 - 117 U/L Final  . AST 07/12/2013 29  0 - 37 U/L Final  . ALT 07/12/2013 46  0 - 53 U/L Final  . Total Protein 07/12/2013 6.8  6.0 - 8.3 g/dL Final  . Albumin 29/56/2130 3.5  3.5 - 5.2 g/dL Final  . Calcium 86/57/8469 8.9  8.4 - 10.5 mg/dL Final  . GFR 62/95/2841 92.82  >60.00 mL/min Final  . Cholesterol 07/12/2013 178  0 - 200 mg/dL Final   ATP III Classification       Desirable:  < 200 mg/dL               Borderline High:  200 - 239 mg/dL          High:  > = 324 mg/dL  . Triglycerides 07/12/2013 142.0  0.0 - 149.0 mg/dL Final   Normal:  <401 mg/dLBorderline High:  150 - 199 mg/dL  . HDL 07/12/2013 31.00* >39.00 mg/dL Final  . VLDL 02/72/5366 28.4  0.0 - 40.0 mg/dL Final  . LDL Cholesterol 07/12/2013 119* 0 - 99 mg/dL Final  . Total CHOL/HDL Ratio 07/12/2013 6   Final                  Men          Women1/2 Average Risk     3.4          3.3Average Risk          5.0          4.42X Average Risk          9.6          7.13X Average Risk          15.0          11.0                      . TSH 07/12/2013 1.51  0.35 - 5.50 uIU/mL Final  . Hemoglobin A1C 07/12/2013 6.6* 4.6 - 6.5 % Final   Glycemic Control Guidelines for People with Diabetes:Non Diabetic:  <6%Goal of Therapy: <7%Additional Action Suggested:  >8%      Examination:   BP 128/82  Pulse 75  Temp(Src) 98.3 F (36.8 C)  Resp 16  Ht 6\' 1"  (1.854 m)  Wt 241 lb 9.6 oz (109.589 kg)  BMI 31.88 kg/m2  SpO2 95%  Body mass index is 31.88 kg/(m^2).    no pedal edema  ASSESSMENT/ PLAN:   1. Diabetes type 2   The patient's diabetes control appears to be reasonably well-controlled  considering his age and duration of diabetes A1c is near-normal His main difficulty is losing weight. Although he is concerned about his fasting blood sugars being relatively higher in the last few months this may not be significant since A1c is 6 only 6.6 Will not increase his Amaryl because of potential for hypoglycemia especially if he is more active during the day Advised him to take his WelChol daily Discussed blood sugar targets He will check more readings after meals and bring his monitor for download on the next  visit   2. Lipids: He is only taking WelChol, needs to do this regularly for  hypercholesterolemia, discussed. He is reluctant to try statin drugs because of muscle cramps  3. Hypertension: Well controlled; however because of his irritating dry cough will change his Monopril to Avapro 150 mg. Advised him to check his blood pressure regularly at home  4. Sleep apnea: He is symptomatic with daytime somnolence and again advised him to followup with Dr. Maple Hudson    5. Muscle cramps: These are likely to be idiopathic and recurrent. Not benefiting from empiric magnesium therapy Since they are mostly nocturnal have advised him on stretching exercises of calf muscles before bedtime and also using tonic water at bedtime  6. Vitamin D deficiency: Continue supplements  Total visit time = 25 minutes  Worth Kober 07/17/2013, 10:45 AM

## 2013-07-24 ENCOUNTER — Telehealth: Payer: Self-pay | Admitting: Family Medicine

## 2013-07-24 ENCOUNTER — Ambulatory Visit (INDEPENDENT_AMBULATORY_CARE_PROVIDER_SITE_OTHER): Payer: Medicare Other

## 2013-07-24 DIAGNOSIS — J309 Allergic rhinitis, unspecified: Secondary | ICD-10-CM

## 2013-07-24 MED ORDER — QUININE SULFATE 324 MG PO CAPS: 648.0000 mg | ORAL_CAPSULE | Freq: Every day | ORAL | Status: DC

## 2013-07-24 NOTE — Telephone Encounter (Signed)
Pt was seen on 06-27-13 and suppose to be referred to specialist concerning

## 2013-07-24 NOTE — Telephone Encounter (Signed)
Pt is aware referral to dr Azzie Roupshane anderson in progress

## 2013-07-24 NOTE — Telephone Encounter (Signed)
rx sent in electronically 

## 2013-07-24 NOTE — Telephone Encounter (Signed)
[facility] - [place], [place] - [address] is requesting re-fill on quiNINE (QUALAQUIN) 324 MG capsule

## 2013-07-30 ENCOUNTER — Ambulatory Visit: Payer: Medicare Other

## 2013-08-02 ENCOUNTER — Telehealth: Payer: Self-pay | Admitting: Endocrinology

## 2013-08-02 NOTE — Telephone Encounter (Signed)
Changed bp meds and now readings are 149/86  He used to be 120's/75 with other medication   Please advise patient   Thank You

## 2013-08-02 NOTE — Telephone Encounter (Signed)
Please see below and advise if possible. Thanks

## 2013-08-02 NOTE — Telephone Encounter (Signed)
Can add another half (total 1.5 tabs a day). Call with BPs if not improved.

## 2013-08-07 ENCOUNTER — Ambulatory Visit (INDEPENDENT_AMBULATORY_CARE_PROVIDER_SITE_OTHER): Payer: Medicare Other

## 2013-08-07 DIAGNOSIS — J309 Allergic rhinitis, unspecified: Secondary | ICD-10-CM

## 2013-08-07 NOTE — Telephone Encounter (Signed)
Noted, left message on vm 

## 2013-08-13 ENCOUNTER — Ambulatory Visit (INDEPENDENT_AMBULATORY_CARE_PROVIDER_SITE_OTHER): Payer: Medicare Other

## 2013-08-13 DIAGNOSIS — J309 Allergic rhinitis, unspecified: Secondary | ICD-10-CM

## 2013-08-14 ENCOUNTER — Other Ambulatory Visit: Payer: Self-pay | Admitting: *Deleted

## 2013-08-14 ENCOUNTER — Telehealth: Payer: Self-pay | Admitting: *Deleted

## 2013-08-14 MED ORDER — IRBESARTAN 150 MG PO TABS: ORAL_TABLET | ORAL | Status: DC

## 2013-08-14 NOTE — Telephone Encounter (Signed)
Change his b/p medication only has 1 pill left wants to know how many refills

## 2013-08-20 ENCOUNTER — Ambulatory Visit (INDEPENDENT_AMBULATORY_CARE_PROVIDER_SITE_OTHER): Payer: Medicare Other

## 2013-08-20 DIAGNOSIS — J309 Allergic rhinitis, unspecified: Secondary | ICD-10-CM

## 2013-08-27 ENCOUNTER — Ambulatory Visit (INDEPENDENT_AMBULATORY_CARE_PROVIDER_SITE_OTHER): Payer: Medicare Other

## 2013-08-27 DIAGNOSIS — J309 Allergic rhinitis, unspecified: Secondary | ICD-10-CM

## 2013-09-03 ENCOUNTER — Telehealth: Payer: Self-pay | Admitting: Endocrinology

## 2013-09-03 ENCOUNTER — Ambulatory Visit (INDEPENDENT_AMBULATORY_CARE_PROVIDER_SITE_OTHER): Payer: Medicare Other

## 2013-09-03 DIAGNOSIS — J309 Allergic rhinitis, unspecified: Secondary | ICD-10-CM

## 2013-09-03 NOTE — Telephone Encounter (Signed)
Patient would like to speak with Cole Morgan regarding his blood pressure medication  He states its not working any  Last checked today 159/93  He has no refills as Dr. Lucianne MussKumar up'd his dosage to 1 1/2   Thank You :)

## 2013-09-04 ENCOUNTER — Other Ambulatory Visit: Payer: Self-pay | Admitting: *Deleted

## 2013-09-04 ENCOUNTER — Telehealth: Payer: Self-pay | Admitting: *Deleted

## 2013-09-04 MED ORDER — IRBESARTAN 150 MG PO TABS: ORAL_TABLET | ORAL | Status: DC

## 2013-09-04 NOTE — Telephone Encounter (Signed)
done

## 2013-09-04 NOTE — Telephone Encounter (Signed)
1st increase dose to 2 daily, Rx 300mg  qd, see me if not better in 3 weeks

## 2013-09-04 NOTE — Telephone Encounter (Signed)
Patient called about his Irbesartan, he said it's not doing anything to help his blood pressure, he's staying around 143/92 and 153/93.  He said he wants something else instead. Please advise  CB# [phone]

## 2013-09-04 NOTE — Telephone Encounter (Signed)
Patient is aware, rx sent for increased dose

## 2013-09-05 ENCOUNTER — Ambulatory Visit (INDEPENDENT_AMBULATORY_CARE_PROVIDER_SITE_OTHER): Payer: Medicare Other

## 2013-09-05 DIAGNOSIS — J309 Allergic rhinitis, unspecified: Secondary | ICD-10-CM

## 2013-09-10 ENCOUNTER — Ambulatory Visit (INDEPENDENT_AMBULATORY_CARE_PROVIDER_SITE_OTHER): Payer: Medicare Other

## 2013-09-10 DIAGNOSIS — J309 Allergic rhinitis, unspecified: Secondary | ICD-10-CM

## 2013-09-17 ENCOUNTER — Ambulatory Visit (INDEPENDENT_AMBULATORY_CARE_PROVIDER_SITE_OTHER): Payer: Medicare Other

## 2013-09-17 ENCOUNTER — Telehealth: Payer: Self-pay | Admitting: Endocrinology

## 2013-09-17 ENCOUNTER — Other Ambulatory Visit: Payer: Self-pay | Admitting: *Deleted

## 2013-09-17 DIAGNOSIS — J309 Allergic rhinitis, unspecified: Secondary | ICD-10-CM

## 2013-09-17 MED ORDER — AMLODIPINE BESYLATE 10 MG PO TABS: 10.0000 mg | ORAL_TABLET | Freq: Every day | ORAL | Status: DC

## 2013-09-17 NOTE — Telephone Encounter (Signed)
Patient said his bp is not staying down, he said the only time it seems like it's about normal is about 9 p.m.  He also said he feels nauseous all the time and he's having leg cramps again.  He wants to find some medication that does not have the ingredients that causes leg cramps.

## 2013-09-17 NOTE — Telephone Encounter (Signed)
He can change the Avapro to amlodipine 10 mg daily. If not better he can see me on Thursday or Friday

## 2013-09-17 NOTE — Telephone Encounter (Signed)
Patient states he is having side effects from irbesartan (AVAPRO) 150 MG tablet Patient states he does not want to take this anymore  Please advise   Thank You

## 2013-09-24 ENCOUNTER — Ambulatory Visit (INDEPENDENT_AMBULATORY_CARE_PROVIDER_SITE_OTHER): Payer: Medicare Other

## 2013-09-24 DIAGNOSIS — J309 Allergic rhinitis, unspecified: Secondary | ICD-10-CM

## 2013-10-01 ENCOUNTER — Ambulatory Visit (INDEPENDENT_AMBULATORY_CARE_PROVIDER_SITE_OTHER): Payer: Medicare Other

## 2013-10-01 DIAGNOSIS — J309 Allergic rhinitis, unspecified: Secondary | ICD-10-CM

## 2013-10-08 ENCOUNTER — Other Ambulatory Visit: Payer: Self-pay | Admitting: *Deleted

## 2013-10-08 ENCOUNTER — Ambulatory Visit (INDEPENDENT_AMBULATORY_CARE_PROVIDER_SITE_OTHER): Payer: Medicare Other

## 2013-10-08 ENCOUNTER — Other Ambulatory Visit: Payer: Self-pay | Admitting: Endocrinology

## 2013-10-08 ENCOUNTER — Telehealth: Payer: Self-pay | Admitting: Endocrinology

## 2013-10-08 DIAGNOSIS — J309 Allergic rhinitis, unspecified: Secondary | ICD-10-CM

## 2013-10-08 MED ORDER — TRIAMTERENE-HCTZ 37.5-25 MG PO TABS: ORAL_TABLET | ORAL | Status: DC

## 2013-10-08 NOTE — Telephone Encounter (Signed)
He has an appt on Wednesday the 29th.

## 2013-10-08 NOTE — Telephone Encounter (Signed)
Patient state that his blood pressure medication is not working  He is having swelling stomach, legs and feet are swelling, and constipation   Please advise patient   Thank You

## 2013-10-08 NOTE — Telephone Encounter (Signed)
Meanwhile stop amlodipine and start Maxzide-25, half tablet daily

## 2013-10-08 NOTE — Telephone Encounter (Signed)
Need to see him this week

## 2013-10-08 NOTE — Telephone Encounter (Signed)
Please see below and advise.

## 2013-10-09 ENCOUNTER — Telehealth: Payer: Self-pay | Admitting: Family Medicine

## 2013-10-09 ENCOUNTER — Other Ambulatory Visit: Payer: Self-pay | Admitting: *Deleted

## 2013-10-09 MED ORDER — ERGOCALCIFEROL 1.25 MG (50000 UT) PO CAPS: 50000.0000 [IU] | ORAL_CAPSULE | ORAL | Status: DC

## 2013-10-09 NOTE — Telephone Encounter (Signed)
Noted, patient is aware, rx sent 

## 2013-10-09 NOTE — Telephone Encounter (Signed)
[facility] requesting 90 day supply forquiNINE (QUALAQUIN) 324 MG capsule  [phone] fx

## 2013-10-10 ENCOUNTER — Other Ambulatory Visit: Payer: Self-pay | Admitting: *Deleted

## 2013-10-10 ENCOUNTER — Encounter: Payer: Self-pay | Admitting: Endocrinology

## 2013-10-10 ENCOUNTER — Ambulatory Visit (INDEPENDENT_AMBULATORY_CARE_PROVIDER_SITE_OTHER): Payer: Medicare Other | Admitting: Endocrinology

## 2013-10-10 VITALS — BP 138/80 | HR 66 | Temp 97.7°F | Wt 246.0 lb

## 2013-10-10 DIAGNOSIS — E119 Type 2 diabetes mellitus without complications: Secondary | ICD-10-CM

## 2013-10-10 DIAGNOSIS — R252 Cramp and spasm: Secondary | ICD-10-CM

## 2013-10-10 DIAGNOSIS — I10 Essential (primary) hypertension: Secondary | ICD-10-CM

## 2013-10-10 MED ORDER — QUININE SULFATE 324 MG PO CAPS: 648.0000 mg | ORAL_CAPSULE | Freq: Every day | ORAL | Status: DC

## 2013-10-10 NOTE — Patient Instructions (Addendum)
Irbesartan 150mg  daily with 1/2 fluid pill daily  Please check blood sugars at least half the time about 2 hours after any meal and times per week on waking up. Please bring blood sugar monitor to each visit

## 2013-10-10 NOTE — Progress Notes (Signed)
Patient ID: Cole Morgan, male   DOB: 1940/11/12, 73 y.o.   MRN: [id number]   Reason for Appointment: Blood pressure and diabetes follow-up   History of Present Illness   Diagnosis: Type 2 DIABETES MELITUS, date of diagnosis:  2000  He usually has good control of his diabetes with oral regimen of Actoplusmet, Januvia and low-dose Amaryl  Takes Amaryl in the evening Usually checking blood sugar before meals and sometimes after evening meal He says that his fasting blood sugars have been higher since he had his foot fracture However his A1c has been near normal Again he has difficulty losing weight, weight has gone up further          Monitors blood glucose: Once or twice a day.    Glucometer: ?         Blood Glucose readings from home diary: readings before breakfast: Usually 110-130; pm usually near-normal but not writing them down  Hypoglycemia frequency: Never.          Meals: 3 meals per day.          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  Always use your most recent med list.               Acrivastine-Pseudoephedrine 8-60 MG Caps  1 cap twice daily for nasal congestion if needed     acyclovir ointment 5 %  Commonly known as:  ZOVIRAX  Apply 1 application topically every 3 (three) hours.     amLODipine 10 MG tablet  Commonly known as:  NORVASC  Take 1 tablet (10 mg total) by mouth daily.     Azelastine-Fluticasone 137-50 MCG/ACT Susp  Commonly known as:  DYMISTA  Place  2 sprays into both nostrils at bedtime.     ergocalciferol 50000 UNITS capsule  Commonly known as:  VITAMIN D2  Take 1 capsule (50,000 Units total) by mouth once a week.     glimepiride 1 MG tablet  Commonly known as:  AMARYL  Take 1 mg by mouth at bedtime.     HYDROcodone-homatropine 5-1.5 MG/5ML syrup  Commonly known as:  HYDROMET  Take 5 mLs by mouth every 4 (four) hours as needed for cough.     irbesartan 150 MG tablet  Commonly known as:  AVAPRO  Take 2 tablets daily     JANUVIA 100 MG tablet  Generic drug:  sitaGLIPtin  TAKE 1 TABLET DAILY     mometasone 50 MCG/ACT nasal spray  Commonly known as:  NASONEX  Place 2 sprays into the nose daily.     pioglitazone-metformin 15-850 MG per tablet  Commonly known as:  ACTOPLUS MET  Take 1 tablet by mouth 2 (two) times daily with a meal.     quiNINE 324 MG capsule  Commonly known as:  QUALAQUIN  Take 2  capsules (648 mg total) by mouth at bedtime.     traMADol 50 MG tablet  Commonly known as:  ULTRAM  Take 2 tablets (100 mg total) by mouth every 6 (six) hours as needed for pain.     triamterene-hydrochlorothiazide 37.5-25 MG per tablet  Commonly known as:  MAXZIDE-25  Take 1/2 tablet daily     WELCHOL 3.75 G Pack  Generic drug:  Colesevelam HCl  Take 3.75 g by mouth.        Allergies:  Allergies  Allergen Reactions  . Levaquin [Levofloxacin In D5w] Other (See Comments)    Cramps all over body.  [name] [Rosuvastatin Calcium]     myalgias    Past Medical History  Diagnosis Date  . Sleep apnea     seesd Dr. Jetty Duhamel, intolerant of CPAP  . ALLERGIC RHINITIS     sees Dr. Jetty Duhamel  . Type II or unspecified type diabetes mellitus without mention of complication, not stated as uncontrolled     sees Dr. Reather Littler  . Hypertension   . Hyperlipidemia     sees Dr. Reather Littler  . Chronic low back pain   . Asthma     Past Surgical History  Procedure Laterality Date  . Back surgery    . Knee surgery     . Shoulder surgery      multiple sugeries, injured riding horses  . Colonoscopy  11-29-07    per Dr. Jarold Motto, polyps, repeat in 3 yrs     Family History  Problem Relation Age of Onset  . Coronary artery disease    . Diabetes    . Asthma    . Allergies    . Colon cancer Neg Hx     Social History:  reports that he quit smoking about 28 years ago. His smoking use included Cigarettes. He has a 15 pack-year smoking history. He has never used smokeless tobacco. He reports that he does not drink alcohol or use illicit drugs.  Review of Systems:  Getting leg cramps despite some magnesium/calcium tabs, on 1 daily of each.  He has had periodic blood tests for quite some time and are likely to be idiopathic. He claims this is related to medications and he thinks that they were temporarily better when he changed his Monopril to Avapro He has had extensive evaluation from his primary care physician and could not afford the quinine that was prescribed because of cost. However he thinks he can get this from the mail-order company for $10.  Still has not seen rheumatologist as suggested by PCP  HYPERTENSION:  has been usually well controlled but since his last visit he has been calling frequently to state that his blood pressure has been very high. Using an [employer] monitor and has a relatively larger cuff However blood pressure appears to be falsely high on his monitor today His readings were 136/93, 152/93 and office readings were in the 80-84 diastolic range checked twice Since he claimed that his blood pressure was high with Avapro 300 mg he was switched to amlodipine but apparently blood pressure was not better and he recently started having a lot of leg edema Now taking half tablet Maxzide this week and not taking Avapro  HYPERLIPIDEMIA: The lipid abnormality consists of elevated LDL, previously has had  complained  of muscle cramps with statin drugs including lovastatin and pravastatin. He is  not consistent with Welchol and LDL needs to be rechecked  Lab Results  Component  Value Date   CHOL 178 07/12/2013   HDL 31.00* 07/12/2013   LDLCALC 119* 07/12/2013   TRIG 142.0 07/12/2013   CHOLHDL 6 07/12/2013      Insomnia, chronic, uses a  sleeping medication  History of mild vitamin D deficiency, the lowest level was about 25, on supplements now with good control   Cough is better and may have been related to Monopril which was stopped earlier this year  LABS:  No visits with results within 1 Week(s) from this visit. Latest known visit with results is:  Appointment on 07/12/2013  Component Date Value Ref Range Status  . Sodium 07/12/2013 138  135 - 145 mEq/L Final  . Potassium 07/12/2013 4.2  3.5 - 5.1 mEq/L Final  . Chloride 07/12/2013 104  96 - 112 mEq/L Final  . CO2 07/12/2013 27  19 - 32 mEq/L Final  . Glucose, Bld 07/12/2013 124* 70 - 99 mg/dL Final  . BUN 40/98/1191 18  6 - 23 mg/dL Final  . Creatinine, Ser 07/12/2013 0.9  0.4 - 1.5 mg/dL Final  . Total Bilirubin 07/12/2013 0.6  0.3 - 1.2 mg/dL Final  . Alkaline Phosphatase 07/12/2013 31* 39 - 117 U/L Final  . AST 07/12/2013 29  0 - 37 U/L Final  . ALT 07/12/2013 46  0 - 53 U/L Final  . Total Protein 07/12/2013 6.8  6.0 - 8.3 g/dL Final  . Albumin 47/82/9562 3.5  3.5 - 5.2 g/dL Final  . Calcium 13/10/6576 8.9  8.4 - 10.5 mg/dL Final  . GFR 46/96/2952 92.82  >60.00 mL/min Final  . Cholesterol 07/12/2013 178  0 - 200 mg/dL Final   ATP III Classification       Desirable:  < 200 mg/dL               Borderline High:  200 - 239 mg/dL          High:  > = 841 mg/dL  . Triglycerides 07/12/2013 142.0  0.0 - 149.0 mg/dL Final   Normal:  <324 mg/dLBorderline High:  150 - 199 mg/dL  . HDL 07/12/2013 31.00* >39.00 mg/dL Final  . VLDL 40/12/2723 28.4  0.0 - 40.0 mg/dL Final  . LDL Cholesterol 07/12/2013 119* 0 - 99 mg/dL Final  . Total CHOL/HDL Ratio 07/12/2013 6   Final                  Men          Women1/2 Average Risk      3.4          3.3Average Risk          5.0          4.42X Average Risk          9.6          7.13X Average Risk          15.0          11.0                      . TSH 07/12/2013 1.51  0.35 - 5.50 uIU/mL Final  . Hemoglobin A1C 07/12/2013 6.6* 4.6 - 6.5 % Final   Glycemic Control Guidelines for People with Diabetes:Non Diabetic:  <6%Goal of Therapy: <7%Additional Action Suggested:  >8%      Examination:   BP 138/80  Pulse 66  Temp(Src) 97.7 F (36.5 C) (Oral)  Wt 246 lb (111.585 kg)  SpO2 96%  Body mass index is 32.46 kg/(m^2).   Repeat blood pressure 135/84 with large cuff  2+ pedal edema present No lesions on his feet  ASSESSMENT/ PLAN:   1. Diabetes type 2   The patient's diabetes control appears to be reasonably well-controlled according to his home fasting readings A1c previously has been near-normal His main difficulty is losing weight. He thinks he is staying active Discussed blood sugar targets He will check more readings after meals and bring his monitor for download on the next visit   2. Lipids: He is only taking WelChol, needs to take this regularly for  hypercholesterolemia and lipids will be checked on the next visit  3. Hypertension: He is not getting accurate readings at home with his monitor and since he has edema he will continue low dose Maxzide and also start back on Avapro 150 mg. He will return for blood pressure check in 3 weeks. He cannot rely on his home monitor for blood pressure His edema is likely be from Norvasc and this will not be restarted  4. Muscle cramps: These are likely to be idiopathic and recurrent. Not benefiting from empiric magnesium therapy Prescription for quinine sent to his mail-order pharmacy  5. Vitamin D deficiency: Continue supplements  Counseling time over 50% of today's 25 minute visit  Ota Ebersole 10/10/2013, 9:47 AM

## 2013-10-11 MED ORDER — QUININE SULFATE 324 MG PO CAPS: 648.0000 mg | ORAL_CAPSULE | Freq: Every day | ORAL | Status: AC

## 2013-10-11 NOTE — Telephone Encounter (Signed)
Sal from Pharmacy need to verify dx code for meds Qualaquin 324 mg,  Please advise.  Ref #  [id number] Phone # [phone] ask for sal

## 2013-10-11 NOTE — Telephone Encounter (Signed)
Okay per Dr. Clent RidgesFry to order and take 2 at bedtime. I did send script e-scribe.

## 2013-10-15 ENCOUNTER — Ambulatory Visit (INDEPENDENT_AMBULATORY_CARE_PROVIDER_SITE_OTHER): Payer: Medicare Other

## 2013-10-15 DIAGNOSIS — J309 Allergic rhinitis, unspecified: Secondary | ICD-10-CM

## 2013-10-16 ENCOUNTER — Ambulatory Visit: Payer: Medicare Other | Admitting: Endocrinology

## 2013-10-22 ENCOUNTER — Ambulatory Visit (INDEPENDENT_AMBULATORY_CARE_PROVIDER_SITE_OTHER): Payer: Medicare Other

## 2013-10-22 DIAGNOSIS — J309 Allergic rhinitis, unspecified: Secondary | ICD-10-CM

## 2013-10-24 ENCOUNTER — Encounter: Payer: Self-pay | Admitting: Internal Medicine

## 2013-10-29 ENCOUNTER — Ambulatory Visit (INDEPENDENT_AMBULATORY_CARE_PROVIDER_SITE_OTHER): Payer: Medicare Other

## 2013-10-29 DIAGNOSIS — J309 Allergic rhinitis, unspecified: Secondary | ICD-10-CM

## 2013-11-05 ENCOUNTER — Ambulatory Visit (INDEPENDENT_AMBULATORY_CARE_PROVIDER_SITE_OTHER): Payer: Medicare Other

## 2013-11-05 DIAGNOSIS — J309 Allergic rhinitis, unspecified: Secondary | ICD-10-CM

## 2013-11-06 ENCOUNTER — Ambulatory Visit (INDEPENDENT_AMBULATORY_CARE_PROVIDER_SITE_OTHER): Payer: Medicare Other | Admitting: Endocrinology

## 2013-11-06 ENCOUNTER — Other Ambulatory Visit: Payer: Self-pay | Admitting: *Deleted

## 2013-11-06 ENCOUNTER — Encounter: Payer: Self-pay | Admitting: Endocrinology

## 2013-11-06 VITALS — BP 124/72 | HR 72 | Temp 98.6°F | Resp 16 | Ht 73.0 in | Wt 241.8 lb

## 2013-11-06 DIAGNOSIS — E785 Hyperlipidemia, unspecified: Secondary | ICD-10-CM

## 2013-11-06 DIAGNOSIS — I10 Essential (primary) hypertension: Secondary | ICD-10-CM

## 2013-11-06 DIAGNOSIS — E119 Type 2 diabetes mellitus without complications: Secondary | ICD-10-CM

## 2013-11-06 LAB — BASIC METABOLIC PANEL
BUN: 13 mg/dL (ref 6–23)
CO2: 25 mEq/L (ref 19–32)
Calcium: 9.1 mg/dL (ref 8.4–10.5)
Chloride: 101 mEq/L (ref 96–112)
Creatinine, Ser: 0.8 mg/dL (ref 0.4–1.5)
GFR: 102.28 mL/min (ref >60.00)
GLUCOSE: 120 mg/dL — AB (ref 70–99)
POTASSIUM: 4 meq/L (ref 3.5–5.1)
SODIUM: 134 meq/L — AB (ref 135–145)

## 2013-11-06 LAB — LDL CHOLESTEROL, DIRECT: Direct LDL: 130.9 mg/dL

## 2013-11-06 LAB — HEMOGLOBIN A1C: HEMOGLOBIN A1C: 6.9 % — AB (ref 4.6–6.5)

## 2013-11-06 MED ORDER — COLESEVELAM HCL 625 MG PO TABS: 625.0000 mg | ORAL_TABLET | Freq: Two times a day (BID) | ORAL | Status: DC

## 2013-11-06 NOTE — Progress Notes (Signed)
Patient ID: Cole Morgan, male   DOB: [birth year], 73 y.o.   MRN: [id number]   Reason for Appointment: Blood pressure  follow-up   History of Present Illness    HYPERTENSION:  home  He had reported high blood pressure readings at home since about 6/15 Previously blood pressure has been usually well controlled on Monopril. He was changed to Avapro and then to Norvasc and since he continued to have problems with high blood pressure he was asked to come in for an office visit. Also because of tendency to edema with Norvasc he was started on Maxzide . Using an [employer] monitor and has a proper sized  cuff However blood pressure appears to be falsely high on his monitor when checked in 7/15 His readings recently are 147/92 and he has a high reading this morning also  He was continued on a half tablet of Maxzide and was told to start Avapro 150 mg daily again since blood pressure was still relatively high in 7/15 However he is taking only half of the 150 mg irbesartan. Blood pressure is very good now No recent renal function studies are available     Medication List       This list is accurate as of: 11/06/13 10:02 AM.  Always use your most recent med list.               Acrivastine-Pseudoephedrine 8-60 MG Caps  1 cap twice daily for nasal congestion if needed     acyclovir ointment 5 %  Commonly known as:  ZOVIRAX  Apply 1 application topically every 3 (three) hours.     amLODipine 10 MG tablet  Commonly known as:  NORVASC  Take 1 tablet (10 mg total) by mouth daily.     Azelastine-Fluticasone 137-50 MCG/ACT Susp  Commonly known as:  DYMISTA  Place 2 sprays into both nostrils at bedtime.     ergocalciferol 50000 UNITS capsule  Commonly known as:  VITAMIN D2  Take 1 capsule (50,000 Units total) by mouth once a week.     glimepiride 1 MG tablet  Commonly known as:  AMARYL  Take 1 mg by mouth at bedtime.     HYDROcodone-homatropine 5-1.5 MG/5ML syrup  Commonly known as:   HYDROMET  Take 5 mLs by mouth every 4 (four) hours as needed for cough.     irbesartan 150 MG tablet  Commonly known as:  AVAPRO  150 mg.     JANUVIA 100 MG tablet  Generic drug:  sitaGLIPtin  TAKE 1 TABLET DAILY     mometasone 50 MCG/ACT nasal spray  Commonly known as:  NASONEX  Place 2 sprays into the nose daily.     pioglitazone-metformin 15-850 MG per tablet  Commonly known as:  ACTOPLUS MET  Take 1 tablet by mouth 2 (two) times daily with a meal.     quiNINE 324 MG capsule  Commonly known as:  QUALAQUIN  Take 2 capsules (648 mg total) by mouth at bedtime.     traMADol 50 MG tablet  Commonly known as:  ULTRAM  Take 2 tablets (100 mg total) by mouth every 6 (six) hours as needed for pain.     triamterene-hydrochlorothiazide 37.5-25 MG per tablet  Commonly known as:  MAXZIDE-25  Take 1/2 tablet daily     WELCHOL 3.75 G Pack  Generic drug:  Colesevelam HCl  Take 3.75 g by mouth.        Allergies:  Allergies  Allergen Reactions  .  Levaquin [Levofloxacin In D5w] Other (See Comments)    Cramps all over body.  [name] [Rosuvastatin Calcium]     myalgias    Past Medical History  Diagnosis Date  . Sleep apnea     seesd Dr. Jetty Duhamel, intolerant of CPAP  . ALLERGIC RHINITIS     sees Dr. Jetty Duhamel  . Type II or unspecified type diabetes mellitus without mention of complication, not stated as uncontrolled     sees Dr. Reather Littler  . Hypertension   . Hyperlipidemia     sees Dr. Reather Littler  . Chronic low back pain   . Asthma     Past Surgical History  Procedure Laterality Date  . Back surgery    . Knee surgery    . Shoulder surgery      multiple sugeries, injured riding horses  . Colonoscopy  11-29-07    per Dr. Jarold Motto, polyps, repeat in 3 yrs     Family History  Problem Relation Age of Onset  . Coronary artery disease    . Diabetes    . Asthma    . Allergies    . Colon cancer Neg Hx     Social History:  reports that he quit smoking  about 28 years ago. His smoking use included Cigarettes. He has a 15 pack-year smoking history. He has never used smokeless tobacco. He reports that he does not drink alcohol or use illicit drugs.  Review of Systems:  He is asking about a tendency to profuse sweating when he is out in the heat  He has had a long history of periodic leg cramps despite some magnesium/calcium tabs, on 1 daily of each.  He has not had any cramps except for one since his last visit and he claims this is from changing his blood pressure medicine. However previously also he has had intervals that he does not have cramps and no etiology has been found. Does have quinine tablets to use as needed  HYPERLIPIDEMIA: The lipid abnormality consists of elevated LDL, previously has had  complained  of muscle cramps with statin drugs including lovastatin and pravastatin. He is not consistent with Welchol and ran out. He prefers not to take the powder    Lab Results  Component Value Date   CHOL 178 07/12/2013   HDL 31.00* 07/12/2013   LDLCALC 119* 07/12/2013   TRIG 142.0 07/12/2013   CHOLHDL 6 07/12/2013     LABS:  Pending    Examination:   BP 124/72  Pulse 72  Temp(Src) 98.6 F (37 C)  Resp 16  Ht 6\' 1"  (1.854 m)  Wt 241 lb 12.8 oz (109.68 kg)  BMI 31.91 kg/m2  SpO2 97%  Body mass index is 31.91 kg/(m^2).   Repeat blood pressure 130/76 No pedal edema present  ASSESSMENT/ PLAN:   . Hypertension: He is not getting accurate readings at home with his monitor and is now getting good control with low dose Maxzide and Avapro He will continue the same regimen and not rely on home blood pressure readings  2. 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 Muscle cramps: These are likely to be idiopathic and recurrent. Not benefiting from empiric magnesium therapy Currently not having  symptoms    Saivion Goettel 11/06/2013, 10:02 AM   Labs as follows  Office Visit on 11/06/2013  Component Date Value Ref Range Status  . Hemoglobin A1C 11/06/2013 6.9* 4.6 - 6.5 % Final   Glycemic Control Guidelines for People with Diabetes:Non Diabetic:  <6%Goal of Therapy: <7%Additional Action Suggested:  >8%   . Sodium 11/06/2013 134* 135 - 145 mEq/L Final  . Potassium 11/06/2013 4.0  3.5 - 5.1 mEq/L Final  . Chloride 11/06/2013 101  96 - 112 mEq/L Final  . CO2 11/06/2013 25  19 - 32 mEq/L Final  . Glucose, Bld 11/06/2013 120* 70 - 99 mg/dL Final  . BUN 96/06/5407 13  6 - 23 mg/dL Final  . Creatinine, Ser 11/06/2013 0.8  0.4 - 1.5 mg/dL Final  . Calcium 81/19/1478 9.1  8.4 - 10.5 mg/dL Final  . GFR 29/56/2130 102.28  >60.00 mL/min Final  . Direct LDL 11/06/2013 130.9   Final   Optimal:  <100 mg/dLNear or Above Optimal:  100-129 mg/dLBorderline High:  130-159 mg/dLHigh:  160-189 mg/dLVery High:  >190 mg/dL

## 2013-11-06 NOTE — Patient Instructions (Signed)
Irbesartan 1/2 of 150 mg daily

## 2013-11-07 ENCOUNTER — Other Ambulatory Visit: Payer: Self-pay | Admitting: *Deleted

## 2013-11-07 ENCOUNTER — Telehealth: Payer: Self-pay | Admitting: Endocrinology

## 2013-11-07 MED ORDER — IRBESARTAN 150 MG PO TABS: 150.0000 mg | ORAL_TABLET | Freq: Every day | ORAL | Status: AC

## 2013-11-07 MED ORDER — COLESEVELAM HCL 625 MG PO TABS: ORAL_TABLET | ORAL | Status: AC

## 2013-11-07 MED ORDER — TRIAMTERENE-HCTZ 37.5-25 MG PO TABS: ORAL_TABLET | ORAL | Status: DC

## 2013-11-07 NOTE — Telephone Encounter (Signed)
Patient would like his blood pressure medication called into his pharmacy   Pharmacy: [facility]  Thank You

## 2013-11-07 NOTE — Telephone Encounter (Signed)
rx sent

## 2013-11-08 ENCOUNTER — Telehealth: Payer: Self-pay | Admitting: *Deleted

## 2013-11-08 NOTE — Telephone Encounter (Signed)
Patient came to the office for a printout of his labs, he has questions about them, please send results so I can explain them to him

## 2013-11-08 NOTE — Telephone Encounter (Signed)
Patient ask if you would refax Welchol 625, and lab results.

## 2013-11-12 ENCOUNTER — Ambulatory Visit (INDEPENDENT_AMBULATORY_CARE_PROVIDER_SITE_OTHER): Payer: Medicare Other

## 2013-11-12 DIAGNOSIS — J309 Allergic rhinitis, unspecified: Secondary | ICD-10-CM

## 2013-11-20 ENCOUNTER — Ambulatory Visit (INDEPENDENT_AMBULATORY_CARE_PROVIDER_SITE_OTHER): Payer: Medicare Other

## 2013-11-20 DIAGNOSIS — J309 Allergic rhinitis, unspecified: Secondary | ICD-10-CM

## 2013-11-26 ENCOUNTER — Ambulatory Visit (INDEPENDENT_AMBULATORY_CARE_PROVIDER_SITE_OTHER): Payer: Medicare Other

## 2013-11-26 DIAGNOSIS — J309 Allergic rhinitis, unspecified: Secondary | ICD-10-CM

## 2013-11-26 LAB — HM DIABETES EYE EXAM

## 2013-12-03 ENCOUNTER — Ambulatory Visit (INDEPENDENT_AMBULATORY_CARE_PROVIDER_SITE_OTHER): Payer: Medicare Other

## 2013-12-03 DIAGNOSIS — J309 Allergic rhinitis, unspecified: Secondary | ICD-10-CM

## 2013-12-06 ENCOUNTER — Encounter: Payer: Self-pay | Admitting: Internal Medicine

## 2013-12-06 ENCOUNTER — Ambulatory Visit (INDEPENDENT_AMBULATORY_CARE_PROVIDER_SITE_OTHER): Payer: Medicare Other | Admitting: Internal Medicine

## 2013-12-06 VITALS — BP 148/74 | HR 71 | Ht 73.0 in | Wt 244.4 lb

## 2013-12-06 DIAGNOSIS — J302 Other seasonal allergic rhinitis: Secondary | ICD-10-CM

## 2013-12-06 DIAGNOSIS — J3089 Other allergic rhinitis: Principal | ICD-10-CM

## 2013-12-06 DIAGNOSIS — H1045 Other chronic allergic conjunctivitis: Secondary | ICD-10-CM | POA: Insufficient documentation

## 2013-12-06 DIAGNOSIS — R21 Rash and other nonspecific skin eruption: Secondary | ICD-10-CM

## 2013-12-06 DIAGNOSIS — J309 Allergic rhinitis, unspecified: Secondary | ICD-10-CM

## 2013-12-06 MED ORDER — AZELASTINE-FLUTICASONE 137-50 MCG/ACT NA SUSP: NASAL | Status: DC

## 2013-12-06 NOTE — Progress Notes (Signed)
Patient ID: Cole Morgan, male    DOB: 19-Jan-1941, 73 y.o.   MRN: [id number]  HPI 73 yo former smoker with hx of allergic rhinitis, skin test positive. Hx obstructive sleep apnea (AHI 15.4/hr)  intolerant of CPAP. Hx of multiple traumas, mostly from horse-riding injuries. I last saw him 2006. He wants reassessment of his allergy status. Blames allergy for making him sleepy. Describes bronchitis each winter, which he attributes to allergy rather than colds- with productive cough, head and chest congestion. Went to [facility] ER twice this winter with nasal and chest congestion treated with antibiotics, prednisone taper, nose spray. He has been much better in the past month. Today he notes productive cough, white phlegm. He expects to treat this as an acute event, after which he expects to stay well through the summer. Denies fever, purulent mucus, blood, nodes, sneeze.  03/17/11- 34 yo former smoker with hx of allergic rhinitis, allergic conjunctivitis, skin test positive, recurrent bronchitis. Hx obstructive sleep apnea (AHI 15.4/hr)  intolerant of CPAP. Hx of multiple traumas, mostly from horse-riding injuries. He wants reassessment of his allergy status. Complains of burning itch in throat when he tries to sleep, burning, itching and watering of eyes and nose on waking in AM. Says he has tried multiple otc allergy meds and eye drops w/o benefit.  Allergy Testing-especially positive for house dust but also for numerous grass, weed and tree pollens.  05/17/11- 41 yo former smoker with hx of allergic rhinitis, allergic conjunctivitis, skin test positive, recurrent bronchitis. Hx obstructive sleep apnea (AHI 15.4/hr)  intolerant of CPAP. Hx of multiple traumas, mostly from horse-riding injuries. Increased chest congestion and irritation of eyes and nose with itching and nasal drainage mostly in the last 5 days. He has been outdoors a lot. Much cough but not wheezing. He is building allergy vaccine without  problems.  08/17/11- 8 yo former smoker with hx of allergic rhinitis, allergic conjunctivitis, skin test positive, recurrent bronchitis. Hx obstructive sleep apnea (AHI 15.4/hr)  intolerant of CPAP. Hx of multiple traumas, mostly from horse-riding injuries. Still on vaccine; unsure if allergy injections are really helping. 10 out of 12 months still has increased troubles. 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 We reviewed his skin test results from January. His throat itches some days when he goes into the barn. He did not react to horse but he reacted strongly to grass and less strongly to a number of molds. 10 months of the year he says his eyes itch. He has a history of allergic conjunctivitis when exposed to colds. Somatic pains associated with all of his many orthopedic injuries keep him awake at night.  12/07/11- 34 yo former smoker with hx of allergic rhinitis, allergic conjunctivitis, skin test positive, recurrent bronchitis. Hx obstructive sleep apnea (AHI 15.4/hr)  intolerant of CPAP. Hx of multiple traumas, mostly from horse-riding injuries. Had flu vaccine Still on allergy vaccine (A 1:50, B 1:500) GH,  and ran out of Nasonex; having flare ups since running out. 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 Statins have been causing significant myalgias and he feels better since they were stopped.  07/20/12- 64 yo former smoker with hx of allergic rhinitis, allergic conjunctivitis, skin test positive, recurrent bronchitis. Hx obstructive sleep apnea (AHI 15.4/hr)  intolerant of CPAP. Hx of multiple traumas, mostly from horse-riding injuries. FOLLOWS FOR: still on vaccine and doing well. Denies any flare ups with allergies He continues allergy vaccine 1:50 GH and says he  has done quite well through the spring pollen season. He does still complain of burning and watering eyes. Eye doctor had given him medications which don't help enough. He also blames allergies for his  occipital headaches.  04/19/13-  65 yo former smoker with hx of allergic rhinitis, allergic conjunctivitis, skin test positive, recurrent bronchitis. Hx obstructive sleep apnea (AHI 15.4/hr)  intolerant of CPAP. Hx of multiple traumas, mostly from horse-riding injuries. Dr. Lucianne Muss has referred him back for sleep medicine evaluation. NPSG- 09-17-04- moderate obstructive sleep apnea (AHI 15.4/hr), also Periodic Limb Movement 8.2/ hr. Patient complains of no energy, wants to sleep but can't sleep. He mainly complains of itching, sneezing and itching of the eyes and nose with nasal congestion. Can't sleep because of nasal stuffiness. Palate itches. He gargles with peroxide. He has continued allergy vaccine 1:10 GH Bedtime between 10 PM and 12:30, sleep latency 1-3 hours, waking 3 or 4 times before up between 8 AM and 10 AM. He is retired, living alone with grown family.  05/31/13- 34 yo former smoker with hx of allergic rhinitis, allergic conjunctivitis, skin test positive, recurrent bronchitis. Hx obstructive sleep apnea (AHI 15.4/hr)/  intolerant of CPAP. Hx of multiple traumas, mostly from horse-riding injuries. FOLLOWS FOR: still on allergy vaccine 1:10 GH; states he continues to have trouble with his nasal area.  Complaining again of nasal congestion which is perennial but may be worse now. Dry cough "since I quit chewing tobacco" Gets drowsy sometimes if quiet. We talked again about his experience with CPAP and availability of oral appliances.  12/06/13- 89 yo former smoker with hx of allergic rhinitis, allergic conjunctivitis, skin test positive, recurrent bronchitis. Hx obstructive sleep apnea (AHI 15.4/hr)/  intolerant of CPAP. Hx of multiple traumas, mostly from horse-riding injuries. Recognizes allergy vaccine 1:10 GH has dramatically reduced persistent  itching of eyes. No cough or wheeze. Asks samples of eyedrops. New problem-pruritic lesion on his scrotum that he has been picking at. Asks  referral. CXR-05/31/13 IMPRESSION:  No active cardiopulmonary disease.  Electronically Signed  By: Salome Holmes M.D.  On: 05/31/2013 11:20  ROS-see HPI Constitutional:   No-   weight loss, night sweats, fevers, chills, +fatigue, lassitude. HEENT:   No-  headaches, difficulty swallowing, tooth/dental problems, sore throat,       No-  sneezing, +itching, ear ache, +nasal congestion, post nasal drip,  CV:  No-   chest pain, orthopnea, PND, swelling in lower extremities, anasarca,                                  dizziness, palpitations Resp: No-   shortness of breath with exertion or at rest.              No-   productive cough,  No non-productive cough,  No- coughing up of blood.              No-   change in color of mucus.  No- wheezing.   Skin: +per HPI GI:  No-   heartburn, indigestion, abdominal pain, nausea, vomiting, GU: . MS:  +joint pain or swelling.  Neuro-     nothing unusual Psych:  No- change in mood or affect. No depression or anxiety.  No memory loss.   Objective:   Physical Exam General- Alert, Oriented, Affect-appropriate, Distress- none acute Skin- rash-none, lesions- none, excoriation- none. +Very dry skin w/ some eczema in ears. Lymphadenopathy- none Head- atraumatic  Eyes- Gross vision intact, PERRLA, conjunctivae-clear            Ears- canals red, excoriated, question fluid L>R            Nose- + Turbinate edema,   no-Septal dev, mucus, polyps, erosion,                                                                    No- Perforation,             Throat- Mallampati III , mucosa clear , drainage- none, tonsils- atrophic Neck- flexible , trachea midline, no stridor , thyroid nl, carotid no bruit Chest - symmetrical excursion , unlabored           Heart/CV- RRR , no murmur , no gallop  , no rub, nl s1 s2                           - JVD- none , edema- none, stasis changes- none, varices- none           Lung- clear to P&A, wheeze- none, cough- none ,  dullness-none, rub- none           Chest wall- surgical scars Abd-  Br/ Gen/ Rectal- Not done, not indicated Extrem- cyanosis- none, clubbing, none, atrophy- none, strength- nl Neuro- grossly intact to observation

## 2013-12-06 NOTE — Patient Instructions (Addendum)
We can continue allergy vaccine 1:10 GH  Order- referral to [facility] - dx rash on scrotum (requests male)  Sample and script refill Dymista nasal spray  Sample Pataday or Patanol eye drop if available- 1 drop each eye daily as needed  Flu vax

## 2013-12-06 NOTE — Assessment & Plan Note (Signed)
Complains of lesion on his scrotum Plan-dermatology referral

## 2013-12-06 NOTE — Assessment & Plan Note (Signed)
Better control Plan-continue allergy vaccine. Discussed artificial tears, sample Patanol eye drops

## 2013-12-06 NOTE — Assessment & Plan Note (Signed)
Fair control Plan-continue allergy vaccine

## 2013-12-10 ENCOUNTER — Ambulatory Visit (INDEPENDENT_AMBULATORY_CARE_PROVIDER_SITE_OTHER): Payer: Medicare Other

## 2013-12-10 ENCOUNTER — Telehealth: Payer: Self-pay | Admitting: Internal Medicine

## 2013-12-10 DIAGNOSIS — J309 Allergic rhinitis, unspecified: Secondary | ICD-10-CM

## 2013-12-10 NOTE — Telephone Encounter (Signed)
Called pt Cole Morgan x1 

## 2013-12-11 NOTE — Telephone Encounter (Signed)
lmtcb

## 2013-12-12 NOTE — Telephone Encounter (Signed)
lmomtcb for pt 

## 2013-12-12 NOTE — Telephone Encounter (Signed)
Pt forgot to ask Dr Maple HudsonYoung at last ov how long he wants him to stay on allergy shots.  He thinks he has been on them for 3 yrs now and does feel like they help.  Please advise.  Ok to leave message per pt.

## 2013-12-12 NOTE — Telephone Encounter (Signed)
Ok to stop allergy vaccine if he feels shots are not helping

## 2013-12-12 NOTE — Telephone Encounter (Signed)
We can continue allergy shots as long as he feels they are helpful. Often we will consider stopping after 3-5 years, to see how a patient does without them. There is no fixed rule.

## 2013-12-12 NOTE — Telephone Encounter (Signed)
Spoke with the pt  He states that the allergy vaccine does help  He wants to know how much longer he should keep taking  Please advise thanks!

## 2013-12-12 NOTE — Telephone Encounter (Signed)
I tried to call Cole Morgan the line was busy. I'll give him your advise [place].

## 2013-12-17 ENCOUNTER — Ambulatory Visit (INDEPENDENT_AMBULATORY_CARE_PROVIDER_SITE_OTHER): Payer: Medicare Other

## 2013-12-17 DIAGNOSIS — J309 Allergic rhinitis, unspecified: Secondary | ICD-10-CM

## 2013-12-19 ENCOUNTER — Encounter: Payer: Self-pay | Admitting: Internal Medicine

## 2013-12-20 ENCOUNTER — Telehealth: Payer: Self-pay | Admitting: Internal Medicine

## 2013-12-20 NOTE — Telephone Encounter (Signed)
Called and received approval for the dymista.   Approved from 11-20-13 through 12-20-14.   Case ID#  [id number].  i have called the pharmacy and they are aware.

## 2013-12-24 ENCOUNTER — Ambulatory Visit (INDEPENDENT_AMBULATORY_CARE_PROVIDER_SITE_OTHER): Payer: Medicare Other

## 2013-12-24 DIAGNOSIS — J309 Allergic rhinitis, unspecified: Secondary | ICD-10-CM

## 2013-12-31 ENCOUNTER — Ambulatory Visit (INDEPENDENT_AMBULATORY_CARE_PROVIDER_SITE_OTHER): Payer: Medicare Other

## 2013-12-31 DIAGNOSIS — J309 Allergic rhinitis, unspecified: Secondary | ICD-10-CM

## 2014-01-07 ENCOUNTER — Ambulatory Visit: Payer: Medicare Other

## 2014-01-09 ENCOUNTER — Ambulatory Visit (INDEPENDENT_AMBULATORY_CARE_PROVIDER_SITE_OTHER): Payer: Medicare Other

## 2014-01-09 DIAGNOSIS — J309 Allergic rhinitis, unspecified: Secondary | ICD-10-CM

## 2014-01-10 ENCOUNTER — Encounter: Payer: Self-pay | Admitting: Family Medicine

## 2014-01-10 ENCOUNTER — Ambulatory Visit (INDEPENDENT_AMBULATORY_CARE_PROVIDER_SITE_OTHER): Payer: Medicare Other | Admitting: Family Medicine

## 2014-01-10 VITALS — BP 129/78 | HR 72 | Temp 98.0°F | Ht 73.0 in | Wt 245.0 lb

## 2014-01-10 DIAGNOSIS — M5489 Other dorsalgia: Secondary | ICD-10-CM

## 2014-01-10 DIAGNOSIS — B029 Zoster without complications: Secondary | ICD-10-CM | POA: Insufficient documentation

## 2014-01-10 LAB — POCT URINALYSIS DIPSTICK
Bilirubin, UA: NEGATIVE
Blood, UA: NEGATIVE
GLUCOSE UA: NEGATIVE
Ketones, UA: NEGATIVE
NITRITE UA: NEGATIVE
Protein, UA: NEGATIVE
Spec Grav, UA: <=1.005
Urobilinogen, UA: 0.2
pH, UA: 6

## 2014-01-10 MED ORDER — VALACYCLOVIR HCL 1 G PO TABS: 1000.0000 mg | ORAL_TABLET | Freq: Three times a day (TID) | ORAL | Status: AC

## 2014-01-10 MED ORDER — METHYLPREDNISOLONE 4 MG PO KIT: PACK | ORAL | Status: AC

## 2014-01-10 MED ORDER — BACLOFEN 20 MG PO TABS: 20.0000 mg | ORAL_TABLET | Freq: Three times a day (TID) | ORAL | Status: AC

## 2014-01-10 MED ORDER — CLOTRIMAZOLE-BETAMETHASONE 1-0.05 % EX CREA: 1.0000 "application " | TOPICAL_CREAM | Freq: Two times a day (BID) | CUTANEOUS | Status: AC

## 2014-01-10 MED ORDER — OXYCODONE-ACETAMINOPHEN 10-325 MG PO TABS: 1.0000 | ORAL_TABLET | Freq: Four times a day (QID) | ORAL | Status: AC | PRN

## 2014-01-10 NOTE — Progress Notes (Signed)
   Subjective:    Patient ID: Cole HohCharley S Rucci, male    DOB: 1940/05/14, 73 y.o.   MRN: [id number]  HPI Here for 4 days of a rash around the right side which itches and burns, and he has had sharp severe pains shooting around this area. Vicodin has not helped.   Review of Systems  Constitutional: Negative.   Gastrointestinal: Positive for abdominal pain. Negative for nausea, vomiting, diarrhea, constipation, blood in stool, abdominal distention and rectal pain.  Genitourinary: Negative.   Musculoskeletal: Positive for back pain.  Skin: Positive for rash.       Objective:   Physical Exam  Constitutional: He appears well-developed and well-nourished.  Pulmonary/Chest: Effort normal and breath sounds normal.  Abdominal: Soft. Bowel sounds are normal. He exhibits no distension and no mass. There is no tenderness. There is no rebound and no guarding.  Musculoskeletal: Normal range of motion. He exhibits no edema and no tenderness.  Skin:  There is a band of red vesicles on the right side of the trunk from the sternum to the spine           Assessment & Plan:  This is shingles. Treat with Valtrex, a Medrol dose pack, and Percocet.

## 2014-01-10 NOTE — Addendum Note (Signed)
Addended by: Aniceto BossNIMMONS, Andrey Mccaskill A on: 01/10/2014 11:09 AM   Modules accepted: Orders

## 2014-01-10 NOTE — Progress Notes (Signed)
Pre visit review using our clinic review tool, if applicable. No additional management support is needed unless otherwise documented below in the visit note. 

## 2014-01-14 ENCOUNTER — Ambulatory Visit (INDEPENDENT_AMBULATORY_CARE_PROVIDER_SITE_OTHER): Payer: Medicare Other

## 2014-01-14 DIAGNOSIS — J309 Allergic rhinitis, unspecified: Secondary | ICD-10-CM

## 2014-01-16 ENCOUNTER — Encounter: Payer: Self-pay | Admitting: Internal Medicine

## 2014-01-17 ENCOUNTER — Ambulatory Visit (INDEPENDENT_AMBULATORY_CARE_PROVIDER_SITE_OTHER): Payer: Medicare Other

## 2014-01-17 ENCOUNTER — Other Ambulatory Visit: Payer: Self-pay | Admitting: *Deleted

## 2014-01-17 DIAGNOSIS — J309 Allergic rhinitis, unspecified: Secondary | ICD-10-CM

## 2014-01-17 MED ORDER — ERGOCALCIFEROL 1.25 MG (50000 UT) PO CAPS: 50000.0000 [IU] | ORAL_CAPSULE | ORAL | Status: AC

## 2014-01-17 MED ORDER — GLIMEPIRIDE 1 MG PO TABS: 1.0000 mg | ORAL_TABLET | Freq: Every day | ORAL | Status: DC

## 2014-01-21 ENCOUNTER — Other Ambulatory Visit: Payer: Self-pay | Admitting: *Deleted

## 2014-01-21 ENCOUNTER — Ambulatory Visit (INDEPENDENT_AMBULATORY_CARE_PROVIDER_SITE_OTHER): Payer: Medicare Other

## 2014-01-21 DIAGNOSIS — J309 Allergic rhinitis, unspecified: Secondary | ICD-10-CM

## 2014-01-21 MED ORDER — GLIMEPIRIDE 1 MG PO TABS: 1.0000 mg | ORAL_TABLET | Freq: Every day | ORAL | Status: AC

## 2014-01-28 ENCOUNTER — Ambulatory Visit (INDEPENDENT_AMBULATORY_CARE_PROVIDER_SITE_OTHER): Payer: Medicare Other

## 2014-01-28 DIAGNOSIS — J309 Allergic rhinitis, unspecified: Secondary | ICD-10-CM

## 2014-02-01 ENCOUNTER — Other Ambulatory Visit (INDEPENDENT_AMBULATORY_CARE_PROVIDER_SITE_OTHER): Payer: Medicare Other

## 2014-02-01 DIAGNOSIS — I10 Essential (primary) hypertension: Secondary | ICD-10-CM

## 2014-02-01 DIAGNOSIS — E785 Hyperlipidemia, unspecified: Secondary | ICD-10-CM

## 2014-02-01 DIAGNOSIS — E119 Type 2 diabetes mellitus without complications: Secondary | ICD-10-CM

## 2014-02-01 LAB — COMPREHENSIVE METABOLIC PANEL
ALK PHOS: 32 U/L — AB (ref 39–117)
ALT: 56 U/L — AB (ref 0–53)
AST: 34 U/L (ref 0–37)
Albumin: 3.7 g/dL (ref 3.5–5.2)
BUN: 11 mg/dL (ref 6–23)
CO2: 24 mEq/L (ref 19–32)
Calcium: 8.9 mg/dL (ref 8.4–10.5)
Chloride: 103 mEq/L (ref 96–112)
Creatinine, Ser: 0.9 mg/dL (ref 0.4–1.5)
GFR: 86.82 mL/min (ref >60.00)
GLUCOSE: 121 mg/dL — AB (ref 70–99)
Potassium: 4.2 mEq/L (ref 3.5–5.1)
SODIUM: 134 meq/L — AB (ref 135–145)
TOTAL PROTEIN: 7.1 g/dL (ref 6.0–8.3)
Total Bilirubin: 0.6 mg/dL (ref 0.2–1.2)

## 2014-02-01 LAB — LIPID PANEL
CHOLESTEROL: 173 mg/dL (ref 0–200)
HDL: 31.1 mg/dL — ABNORMAL LOW (ref >39.00)
LDL CALC: 108 mg/dL — AB (ref 0–99)
NonHDL: 141.9
Total CHOL/HDL Ratio: 6
Triglycerides: 171 mg/dL — ABNORMAL HIGH (ref 0.0–149.0)
VLDL: 34.2 mg/dL (ref 0.0–40.0)

## 2014-02-01 LAB — HEMOGLOBIN A1C: HEMOGLOBIN A1C: 6.8 % — AB (ref 4.6–6.5)

## 2014-02-04 ENCOUNTER — Ambulatory Visit (INDEPENDENT_AMBULATORY_CARE_PROVIDER_SITE_OTHER): Payer: Medicare Other

## 2014-02-04 DIAGNOSIS — J309 Allergic rhinitis, unspecified: Secondary | ICD-10-CM

## 2014-02-06 ENCOUNTER — Encounter: Payer: Self-pay | Admitting: Endocrinology

## 2014-02-06 ENCOUNTER — Ambulatory Visit (INDEPENDENT_AMBULATORY_CARE_PROVIDER_SITE_OTHER): Payer: Medicare Other | Admitting: Endocrinology

## 2014-02-06 VITALS — BP 110/72 | HR 77 | Temp 97.9°F | Ht 73.0 in | Wt 247.0 lb

## 2014-02-06 DIAGNOSIS — I10 Essential (primary) hypertension: Secondary | ICD-10-CM

## 2014-02-06 DIAGNOSIS — E119 Type 2 diabetes mellitus without complications: Secondary | ICD-10-CM

## 2014-02-06 DIAGNOSIS — E785 Hyperlipidemia, unspecified: Secondary | ICD-10-CM

## 2014-02-06 NOTE — Patient Instructions (Signed)
Welchol 3 tabs together in ams  Please check blood sugars at least half the time about 2 hours after any meal and times per week on waking up. Please bring blood sugar monitor to each visit

## 2014-02-06 NOTE — Progress Notes (Signed)
Patient ID: Cole Morgan, male   DOB: 12-06-1940, 73 y.o.   MRN: [id number]   Reason for Appointment: Blood pressure and diabetes follow-up   History of Present Illness   Diagnosis: Type 2 DIABETES MELITUS, date of diagnosis:  2000  He usually has good control of his diabetes with oral regimen of Actoplusmet, Januvia and low-dose Amaryl  Takes Amaryl in the evening He is still checking blood sugar before meals and sometimes after evening meal Did not bring any record of his blood sugars today; he says his fasting readings are relatively high, was 121 in the lab He has gained about 16 pounds since last year, may be partly from his foot fracture earlier this year He thinks he is watching his portions and generally eating 2 meals but sometimes does have a large meal especially when eating out However his A1c has been near normal Again he has difficulty losing weight, weight has gone up another 3 pounds           Monitors blood glucose: Once or twice a day.    Glucometer: ?         Blood Glucose readings from recall:  Readings before breakfast: 120-150; pcs 150s  Hypoglycemia frequency:  none .          Meals: 3 meals per day. 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  Always use your most recent med list.               Acrivastine-Pseudoephedrine 8-60 MG Caps  1 cap twice daily for nasal congestion if needed     Azelastine-Fluticasone 137-50 MCG/ACT Susp   Commonly known as:  DYMISTA  1-2 puffs each nostril once daily at bedtime, while needed     baclofen 20 MG tablet  Commonly known as:  LIORESAL  Take 1 tablet (20 mg total) by mouth 3 (three) times daily.     clotrimazole-betamethasone cream  Commonly known as:  LOTRISONE  Apply 1 application topically 2 (two) times daily.     colesevelam 625 MG tablet  Commonly known as:  WELCHOL  Take 1 tablet 3 times a day with meal     ergocalciferol 50000 UNITS capsule  Commonly known as:  VITAMIN D2  Take 1 capsule (50,000 Units total) by mouth once a week.     glimepiride 1 MG tablet  Commonly known as:  AMARYL  Take 1 tablet (1 mg total) by mouth daily with breakfast.     irbesartan 150 MG tablet  Commonly known as:  AVAPRO  Take 1 tablet (150 mg total) by mouth daily.     JANUVIA 100 MG tablet  Generic drug:  sitaGLIPtin  TAKE 1 TABLET DAILY     oxyCODONE-acetaminophen 10-325 MG per tablet  Commonly known as:  PERCOCET  Take 1 tablet by mouth every 6 (six) hours as needed for pain.     pioglitazone-metformin 15-850 MG per tablet  Commonly known as:  ACTOPLUS MET  Take 1 tablet by mouth 2 (two) times daily with a meal.     quiNINE 324 MG capsule  Commonly known as:  QUALAQUIN  Take 2 capsules (648 mg total) by mouth at bedtime.     valACYclovir 1000 MG tablet  Commonly known as:  VALTREX  Take 1 tablet (1,000 mg total) by mouth 3 (three) times daily.        Allergies:  Allergies  Allergen Reactions  . Levaquin [Levofloxacin In D5w] Other (See Comments)    Cramps all over body.  [name] [Rosuvastatin Calcium]     myalgias    Past Medical History  Diagnosis Date  . Sleep apnea     seesd Dr. Jetty Duhamel, intolerant of CPAP  . ALLERGIC RHINITIS     sees Dr. Jetty Duhamel  . Type II or unspecified type diabetes mellitus without mention of complication, not stated as uncontrolled     sees Dr. Reather Littler  . Hypertension   . Hyperlipidemia     sees Dr. Reather Littler  . Chronic low back pain   . Asthma     Past Surgical History  Procedure Laterality Date  . Back surgery    . Knee surgery    . Shoulder surgery      multiple sugeries, injured riding horses  . Colonoscopy  11-29-07    per Dr. Jarold Motto, polyps, repeat in 3 yrs     Family History  Problem Relation Age of Onset  . Coronary artery disease    . Diabetes    . Asthma    . Allergies    . Colon cancer Neg Hx     Social History:  reports that he quit smoking about 28 years ago. His smoking use included Cigarettes. He has a 15 pack-year smoking history. He has never used smokeless tobacco. He reports that he does not drink alcohol or use illicit drugs.  Review of Systems:  Leg cramps recently better and has not used quinine which was given to him by a prescription He still takes magnesium/calcium tabs, on 1 daily of each.  He has had periodic blood tests for quite some time and are likely to be idiopathic.  He variously claims this is related to medications, artificial sweeteners and diet drinks  HYPERTENSION:  has been having more difficulty controlling it this year and on the last visit was taking half tablet of Avapro and half tablet of Maxzide. Using an [employer] monitor and has a relatively larger cuff However blood pressure appears to be falsely high on his monitor on his last visit and readings at home are variable He states that when he was taking Maxzide he was having a nerve pain in his left leg and this is better with stopping the Maxzide Blood pressure appears to be fairly good with taking Avapro alone, no lightheadedness  HYPERLIPIDEMIA: The lipid abnormality consists of elevated LDL, previously has had  complained  of muscle cramps with statin drugs including lovastatin and pravastatin. He is not consistent with Welchol and is taking only 1 or 2 tablets a day  Lab Results  Component Value Date   CHOL 173 02/01/2014   HDL 31.10* 02/01/2014   LDLCALC 108* 02/01/2014    LDLDIRECT 130.9 11/06/2013   TRIG 171.0* 02/01/2014  CHOLHDL 6 02/01/2014      Insomnia, chronic, uses a  sleeping medication  History of mild vitamin D deficiency, the lowest level was about 25, on supplements now with good control   LABS:  Appointment on 02/01/2014  Component Date Value Ref Range Status  . Hgb A1c MFr Bld 02/01/2014 6.8* 4.6 - 6.5 % Final   Glycemic Control Guidelines for People with Diabetes:Non Diabetic:  <6%Goal of Therapy: <7%Additional Action Suggested:  >8%   . Sodium 02/01/2014 134* 135 - 145 mEq/L Final  . Potassium 02/01/2014 4.2  3.5 - 5.1 mEq/L Final  . Chloride 02/01/2014 103  96 - 112 mEq/L Final  . CO2 02/01/2014 24  19 - 32 mEq/L Final  . Glucose, Bld 02/01/2014 121* 70 - 99 mg/dL Final  . BUN 21/30/8657 11  6 - 23 mg/dL Final  . Creatinine, Ser 02/01/2014 0.9  0.4 - 1.5 mg/dL Final  . Total Bilirubin 02/01/2014 0.6  0.2 - 1.2 mg/dL Final  . Alkaline Phosphatase 02/01/2014 32* 39 - 117 U/L Final  . AST 02/01/2014 34  0 - 37 U/L Final  . ALT 02/01/2014 56* 0 - 53 U/L Final  . Total Protein 02/01/2014 7.1  6.0 - 8.3 g/dL Final  . Albumin 84/69/6295 3.7  3.5 - 5.2 g/dL Final  . Calcium 28/41/3244 8.9  8.4 - 10.5 mg/dL Final  . GFR 03/17/7251 86.82  >60.00 mL/min Final  . Cholesterol 02/01/2014 173  0 - 200 mg/dL Final   ATP III Classification       Desirable:  < 200 mg/dL               Borderline High:  200 - 239 mg/dL          High:  > = 664 mg/dL  . Triglycerides 02/01/2014 171.0* 0.0 - 149.0 mg/dL Final   Normal:  <403 mg/dLBorderline High:  150 - 199 mg/dL  . HDL 02/01/2014 31.10* >39.00 mg/dL Final  . VLDL 47/42/5956 34.2  0.0 - 40.0 mg/dL Final  . LDL Cholesterol 02/01/2014 108* 0 - 99 mg/dL Final  . Total CHOL/HDL Ratio 02/01/2014 6   Final                  Men          Women1/2 Average Risk     3.4          3.3Average Risk          5.0          4.42X Average Risk          9.6          7.13X Average Risk          15.0          11.0                       . NonHDL 02/01/2014 141.90   Final   NOTE:  Non-HDL goal should be 30 mg/dL higher than patient's LDL goal (i.e. LDL goal of < 70 mg/dL, would have non-HDL goal of < 100 mg/dL)     Examination:   BP 119/69 mmHg  Pulse 77  Temp(Src) 97.9 F (36.6 C) (Oral)  Ht 6\' 1"  (1.854 m)  Wt 247 lb (112.038 kg)  BMI 32.59 kg/m2  SpO2 97%  Body mass index is 32.59 kg/(m^2).   Repeat blood pressure 110/72 with large cuff  Diabetic foot exam shows normal  monofilament sensation in the toes and plantar surfaces, no skin lesions or ulcers on the feet and normal pedal pulses   ASSESSMENT/ PLAN:   1. Diabetes type 2   The patient's diabetes control appears to be reasonably well-controlled and A1c is again less than 7 His main difficulty is losing weight. He thinks he is staying active  He does not want to see a dietitian and claims he can watch his diet Discussed blood sugar targets He will check more readings after meals and bring his monitor for download on the next visit   2. Lipids: He is only taking WelChol but is taking this irregularly and asked him to take 3 tablets together in the morning for better compliance.  He does not want to use the suspension  3. Hypertension: He is probably not getting accurate readings at home with his monitor.  However his control appears to be much easier to achieve with taking only Avapro low-dose  4. Muscle cramps: These are likely to be idiopathic and recurrent. Not having any recently and he has quinine to use as needed  5. Vitamin D deficiency: Continue supplements   Pride Gonzales 02/06/2014, 10:24 AM

## 2014-02-11 ENCOUNTER — Ambulatory Visit (INDEPENDENT_AMBULATORY_CARE_PROVIDER_SITE_OTHER): Payer: Medicare Other

## 2014-02-11 DIAGNOSIS — J309 Allergic rhinitis, unspecified: Secondary | ICD-10-CM

## 2014-02-18 ENCOUNTER — Ambulatory Visit (INDEPENDENT_AMBULATORY_CARE_PROVIDER_SITE_OTHER): Payer: Medicare Other

## 2014-02-18 DIAGNOSIS — J309 Allergic rhinitis, unspecified: Secondary | ICD-10-CM

## 2014-02-25 ENCOUNTER — Ambulatory Visit (INDEPENDENT_AMBULATORY_CARE_PROVIDER_SITE_OTHER): Payer: Medicare Other

## 2014-02-25 DIAGNOSIS — J309 Allergic rhinitis, unspecified: Secondary | ICD-10-CM

## 2014-03-04 ENCOUNTER — Ambulatory Visit (INDEPENDENT_AMBULATORY_CARE_PROVIDER_SITE_OTHER): Payer: Medicare Other

## 2014-03-04 DIAGNOSIS — J309 Allergic rhinitis, unspecified: Secondary | ICD-10-CM

## 2014-03-11 ENCOUNTER — Ambulatory Visit (INDEPENDENT_AMBULATORY_CARE_PROVIDER_SITE_OTHER): Payer: Medicare Other

## 2014-03-11 DIAGNOSIS — J309 Allergic rhinitis, unspecified: Secondary | ICD-10-CM

## 2014-03-18 ENCOUNTER — Ambulatory Visit (INDEPENDENT_AMBULATORY_CARE_PROVIDER_SITE_OTHER): Payer: Medicare Other

## 2014-03-18 DIAGNOSIS — J309 Allergic rhinitis, unspecified: Secondary | ICD-10-CM

## 2014-03-20 ENCOUNTER — Encounter: Payer: Self-pay | Admitting: Internal Medicine

## 2014-03-25 ENCOUNTER — Ambulatory Visit (INDEPENDENT_AMBULATORY_CARE_PROVIDER_SITE_OTHER): Payer: Medicare Other

## 2014-03-25 DIAGNOSIS — J309 Allergic rhinitis, unspecified: Secondary | ICD-10-CM

## 2014-04-01 ENCOUNTER — Ambulatory Visit (INDEPENDENT_AMBULATORY_CARE_PROVIDER_SITE_OTHER): Payer: Medicare Other

## 2014-04-01 DIAGNOSIS — J309 Allergic rhinitis, unspecified: Secondary | ICD-10-CM

## 2014-04-08 ENCOUNTER — Ambulatory Visit (INDEPENDENT_AMBULATORY_CARE_PROVIDER_SITE_OTHER): Payer: Medicare Other

## 2014-04-08 ENCOUNTER — Encounter: Payer: Self-pay | Admitting: Family Medicine

## 2014-04-08 ENCOUNTER — Ambulatory Visit (INDEPENDENT_AMBULATORY_CARE_PROVIDER_SITE_OTHER): Payer: Medicare Other | Admitting: Family Medicine

## 2014-04-08 VITALS — BP 138/69 | HR 79 | Temp 97.9°F | Ht 73.0 in | Wt 246.0 lb

## 2014-04-08 DIAGNOSIS — J209 Acute bronchitis, unspecified: Secondary | ICD-10-CM

## 2014-04-08 DIAGNOSIS — J309 Allergic rhinitis, unspecified: Secondary | ICD-10-CM

## 2014-04-08 MED ORDER — METHYLPREDNISOLONE ACETATE 80 MG/ML IJ SUSP
160.0000 mg | Freq: Once | INTRAMUSCULAR | Status: AC
  Administered 2014-04-08: 160 mg via INTRAMUSCULAR

## 2014-04-08 MED ORDER — ALBUTEROL SULFATE HFA 108 (90 BASE) MCG/ACT IN AERS: 2.0000 | INHALATION_SPRAY | RESPIRATORY_TRACT | Status: AC | PRN

## 2014-04-08 MED ORDER — AMOXICILLIN-POT CLAVULANATE 875-125 MG PO TABS: 1.0000 | ORAL_TABLET | Freq: Two times a day (BID) | ORAL | Status: DC

## 2014-04-08 MED ORDER — HYDROCODONE-HOMATROPINE 5-1.5 MG/5ML PO SYRP: 5.0000 mL | ORAL_SOLUTION | ORAL | Status: AC | PRN

## 2014-04-08 NOTE — Addendum Note (Signed)
Addended by: Aniceto BossNIMMONS, SYLVIA A on: 04/08/2014 12:05 PM   Modules accepted: Orders

## 2014-04-08 NOTE — Progress Notes (Signed)
   Subjective:    Patient ID: Cole Morgan, male    DOB: 1940/03/29, 74 y.o.   MRN: [id number]  HPI Here for 6 days of stuffy head, PND, chest congestion and coughing up green sputum.   Review of Systems  Constitutional: Negative.   HENT: Positive for congestion, postnasal drip and sinus pressure.   Eyes: Negative.   Respiratory: Positive for cough, chest tightness and wheezing.   Cardiovascular: Negative.        Objective:   Physical Exam  Constitutional: He appears well-developed and well-nourished.  HENT:  Right Ear: External ear normal.  Left Ear: External ear normal.  Nose: Nose normal.  Mouth/Throat: Oropharynx is clear and moist.  Eyes: Conjunctivae are normal.  Pulmonary/Chest: Effort normal. No respiratory distress. He has no rales.  Scattered rhonchi and wheezes   Lymphadenopathy:    He has no cervical adenopathy.          Assessment & Plan:  Add Mucinex

## 2014-04-11 ENCOUNTER — Other Ambulatory Visit: Payer: Self-pay | Admitting: *Deleted

## 2014-04-11 MED ORDER — PIOGLITAZONE HCL-METFORMIN HCL 15-850 MG PO TABS: 1.0000 | ORAL_TABLET | Freq: Two times a day (BID) | ORAL | Status: AC

## 2014-04-15 ENCOUNTER — Ambulatory Visit (INDEPENDENT_AMBULATORY_CARE_PROVIDER_SITE_OTHER): Payer: Medicare Other

## 2014-04-15 DIAGNOSIS — J309 Allergic rhinitis, unspecified: Secondary | ICD-10-CM

## 2014-04-22 ENCOUNTER — Ambulatory Visit (INDEPENDENT_AMBULATORY_CARE_PROVIDER_SITE_OTHER): Payer: Medicare Other

## 2014-04-22 DIAGNOSIS — J309 Allergic rhinitis, unspecified: Secondary | ICD-10-CM

## 2014-04-29 ENCOUNTER — Ambulatory Visit (INDEPENDENT_AMBULATORY_CARE_PROVIDER_SITE_OTHER): Payer: Medicare Other

## 2014-04-29 DIAGNOSIS — J309 Allergic rhinitis, unspecified: Secondary | ICD-10-CM

## 2014-05-06 ENCOUNTER — Ambulatory Visit (INDEPENDENT_AMBULATORY_CARE_PROVIDER_SITE_OTHER): Payer: Medicare Other

## 2014-05-06 DIAGNOSIS — J309 Allergic rhinitis, unspecified: Secondary | ICD-10-CM

## 2014-05-13 ENCOUNTER — Ambulatory Visit (INDEPENDENT_AMBULATORY_CARE_PROVIDER_SITE_OTHER): Payer: Medicare Other

## 2014-05-13 DIAGNOSIS — J309 Allergic rhinitis, unspecified: Secondary | ICD-10-CM

## 2014-05-20 ENCOUNTER — Ambulatory Visit (INDEPENDENT_AMBULATORY_CARE_PROVIDER_SITE_OTHER): Payer: Medicare Other

## 2014-05-20 DIAGNOSIS — J309 Allergic rhinitis, unspecified: Secondary | ICD-10-CM | POA: Diagnosis not present

## 2014-05-23 ENCOUNTER — Ambulatory Visit (INDEPENDENT_AMBULATORY_CARE_PROVIDER_SITE_OTHER): Payer: Medicare Other

## 2014-05-23 DIAGNOSIS — J309 Allergic rhinitis, unspecified: Secondary | ICD-10-CM

## 2014-05-27 ENCOUNTER — Ambulatory Visit (INDEPENDENT_AMBULATORY_CARE_PROVIDER_SITE_OTHER): Payer: Medicare Other

## 2014-05-27 DIAGNOSIS — J309 Allergic rhinitis, unspecified: Secondary | ICD-10-CM

## 2014-06-03 ENCOUNTER — Ambulatory Visit (INDEPENDENT_AMBULATORY_CARE_PROVIDER_SITE_OTHER): Payer: Medicare Other

## 2014-06-03 DIAGNOSIS — J309 Allergic rhinitis, unspecified: Secondary | ICD-10-CM | POA: Diagnosis not present

## 2014-06-04 ENCOUNTER — Other Ambulatory Visit (INDEPENDENT_AMBULATORY_CARE_PROVIDER_SITE_OTHER): Payer: Medicare Other

## 2014-06-04 DIAGNOSIS — E785 Hyperlipidemia, unspecified: Secondary | ICD-10-CM

## 2014-06-04 DIAGNOSIS — E119 Type 2 diabetes mellitus without complications: Secondary | ICD-10-CM

## 2014-06-04 LAB — MICROALBUMIN / CREATININE URINE RATIO
Creatinine,U: 79.4 mg/dL
MICROALB/CREAT RATIO: 2 mg/g (ref 0.0–30.0)
Microalb, Ur: 1.6 mg/dL (ref 0.0–1.9)

## 2014-06-04 LAB — BASIC METABOLIC PANEL
BUN: 13 mg/dL (ref 6–23)
CO2: 27 meq/L (ref 19–32)
Calcium: 9.2 mg/dL (ref 8.4–10.5)
Chloride: 101 mEq/L (ref 96–112)
Creatinine, Ser: 0.86 mg/dL (ref 0.40–1.50)
GFR: 92.58 mL/min (ref >60.00)
GLUCOSE: 130 mg/dL — AB (ref 70–99)
POTASSIUM: 4 meq/L (ref 3.5–5.1)
Sodium: 133 mEq/L — ABNORMAL LOW (ref 135–145)

## 2014-06-04 LAB — LDL CHOLESTEROL, DIRECT: Direct LDL: 112 mg/dL

## 2014-06-04 LAB — URINALYSIS, ROUTINE W REFLEX MICROSCOPIC
BILIRUBIN URINE: NEGATIVE
Hgb urine dipstick: NEGATIVE
Ketones, ur: NEGATIVE
Leukocytes, UA: NEGATIVE
NITRITE: NEGATIVE
Specific Gravity, Urine: 1.02 (ref 1.000–1.030)
Total Protein, Urine: NEGATIVE
UROBILINOGEN UA: 0.2 (ref 0.0–1.0)
Urine Glucose: NEGATIVE
pH: 6 (ref 5.0–8.0)

## 2014-06-04 LAB — HEMOGLOBIN A1C: HEMOGLOBIN A1C: 7.1 % — AB (ref 4.6–6.5)

## 2014-06-06 ENCOUNTER — Ambulatory Visit (INDEPENDENT_AMBULATORY_CARE_PROVIDER_SITE_OTHER): Payer: Medicare Other | Admitting: Internal Medicine

## 2014-06-06 ENCOUNTER — Encounter: Payer: Self-pay | Admitting: Internal Medicine

## 2014-06-06 VITALS — BP 148/76 | HR 71 | Ht 73.0 in | Wt 244.6 lb

## 2014-06-06 DIAGNOSIS — J302 Other seasonal allergic rhinitis: Secondary | ICD-10-CM

## 2014-06-06 DIAGNOSIS — H1045 Other chronic allergic conjunctivitis: Secondary | ICD-10-CM

## 2014-06-06 DIAGNOSIS — J309 Allergic rhinitis, unspecified: Secondary | ICD-10-CM | POA: Diagnosis not present

## 2014-06-06 DIAGNOSIS — J3089 Other allergic rhinitis: Principal | ICD-10-CM

## 2014-06-06 MED ORDER — AZELASTINE-FLUTICASONE 137-50 MCG/ACT NA SUSP: NASAL | Status: AC

## 2014-06-06 MED ORDER — ACRIVASTINE-PSEUDOEPHEDRINE 8-60 MG PO CAPS: ORAL_CAPSULE | ORAL | Status: AC

## 2014-06-06 NOTE — Progress Notes (Signed)
Patient ID: Cole Morgan, male    DOB: Oct 17, 1940, 74 y.o.   MRN: [id number]  HPI 74 yo former smoker with hx of allergic rhinitis, skin test positive. Hx obstructive sleep apnea (AHI 15.4/hr)  intolerant of CPAP. Hx of multiple traumas, mostly from horse-riding injuries. I last saw him 2006. He wants reassessment of his allergy status. Blames allergy for making him sleepy. Describes bronchitis each winter, which he attributes to allergy rather than colds- with productive cough, head and chest congestion. [facility] with nasal and chest congestion treated with antibiotics, prednisone taper, nose spray. He has been much better in the past month. Today he notes productive cough, white phlegm. He expects to treat this as an acute event, after which he expects to stay well through the summer. Denies fever, purulent mucus, blood, nodes, sneeze.  03/17/11- 74 yo former smoker with hx of allergic rhinitis, allergic conjunctivitis, skin test positive, recurrent bronchitis. Hx obstructive sleep apnea (AHI 15.4/hr)  intolerant of CPAP. Hx of multiple traumas, mostly from horse-riding injuries. He wants reassessment of his allergy status. Complains of burning itch in throat when he tries to sleep, burning, itching and watering of eyes and nose on waking in AM. Says he has tried multiple otc allergy meds and eye drops w/o benefit.  Allergy Testing-especially positive for house dust but also for numerous grass, weed and tree pollens.  05/17/11- 74 yo former smoker with hx of allergic rhinitis, allergic conjunctivitis, skin test positive, recurrent bronchitis. Hx obstructive sleep apnea (AHI 15.4/hr)  intolerant of CPAP. Hx of multiple traumas, mostly from horse-riding injuries. Increased chest congestion and irritation of eyes and nose with itching and nasal drainage mostly in the last 5 days. He has been outdoors a lot. Much cough but not wheezing. He is building allergy vaccine without  problems.  08/17/11- 89 yo former smoker with hx of allergic rhinitis, allergic conjunctivitis, skin test positive, recurrent bronchitis. Hx obstructive sleep apnea (AHI 15.4/hr)  intolerant of CPAP. Hx of multiple traumas, mostly from horse-riding injuries. Still on vaccine; unsure if allergy injections are really helping. 10 out of 12 months still has increased troubles. 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 We reviewed his skin test results from January. His throat itches some days when he goes into the barn. He did not react to horse but he reacted strongly to grass and less strongly to a number of molds. 10 months of the year he says his eyes itch. He has a history of allergic conjunctivitis when exposed to colds. Somatic pains associated with all of his many orthopedic injuries keep him awake at night.  12/07/11- 74 yo former smoker with hx of allergic rhinitis, allergic conjunctivitis, skin test positive, recurrent bronchitis. Hx obstructive sleep apnea (AHI 15.4/hr)  intolerant of CPAP. Hx of multiple traumas, mostly from horse-riding injuries. Had flu vaccine Still on allergy vaccine (A 1:50, B 1:500) GH,  and ran out of Nasonex; having flare ups since running out. 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 Statins have been causing significant myalgias and he feels better since they were stopped.  07/20/12- 74 yo former smoker with hx of allergic rhinitis, allergic conjunctivitis, skin test positive, recurrent bronchitis. Hx obstructive sleep apnea (AHI 15.4/hr)  intolerant of CPAP. Hx of multiple traumas, mostly from horse-riding injuries. FOLLOWS FOR: still on vaccine and doing well. Denies any flare ups with allergies He continues allergy vaccine 1:50 GH and says he  has done quite well through the spring pollen season. He does still complain of burning and watering eyes. Eye doctor had given him medications which don't help enough. He also blames allergies for his  occipital headaches.  04/19/13-  74 yo former smoker with hx of allergic rhinitis, allergic conjunctivitis, skin test positive, recurrent bronchitis. Hx obstructive sleep apnea (AHI 15.4/hr)  intolerant of CPAP. Hx of multiple traumas, mostly from horse-riding injuries. Dr. Lucianne Muss has referred him back for sleep medicine evaluation. NPSG- 09-17-04- moderate obstructive sleep apnea (AHI 15.4/hr), also Periodic Limb Movement 8.2/ hr. Patient complains of no energy, wants to sleep but can't sleep. He mainly complains of itching, sneezing and itching of the eyes and nose with nasal congestion. Can't sleep because of nasal stuffiness. Palate itches. He gargles with peroxide. He has continued allergy vaccine 1:10 GH Bedtime between 10 PM and 12:30, sleep latency 1-3 hours, waking 3 or 4 times before up between 8 AM and 10 AM. He is retired, living alone with grown family.  05/31/13- 42 yo former smoker with hx of allergic rhinitis, allergic conjunctivitis, skin test positive, recurrent bronchitis. Hx obstructive sleep apnea (AHI 15.4/hr)/  intolerant of CPAP. Hx of multiple traumas, mostly from horse-riding injuries. FOLLOWS FOR: still on allergy vaccine 1:10 GH; states he continues to have trouble with his nasal area.  Complaining again of nasal congestion which is perennial but may be worse now. Dry cough "since I quit chewing tobacco" Gets drowsy sometimes if quiet. We talked again about his experience with CPAP and availability of oral appliances.  12/06/13- 8 yo former smoker with hx of allergic rhinitis, allergic conjunctivitis, skin test positive, recurrent bronchitis. Hx obstructive sleep apnea (AHI 15.4/hr)/  intolerant of CPAP. Hx of multiple traumas, mostly from horse-riding injuries. Recognizes allergy vaccine 1:10 GH has dramatically reduced persistent  itching of eyes. No cough or wheeze. Asks samples of eyedrops. New problem-pruritic lesion on his scrotum that he has been picking at. Asks  referral. CXR-05/31/13 IMPRESSION:  No active cardiopulmonary disease.  Electronically Signed  By: Salome Holmes M.D.  On: 05/31/2013 11:20  06/06/14-72 yo former smoker with hx of allergic rhinitis, allergic conjunctivitis, skin test positive, recurrent bronchitis. Hx obstructive sleep apnea (AHI 15.4/hr)/  intolerant of CPAP. 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 FOLLOWS FOR  still on Allergy vaccine 1:10 GH and doing well.  No particular problems with spring pollen so far. Occasionally throat itches a little. He has now been on current vaccine for 3 years and is interested in how long to continue.  ROS-see HPI Constitutional:   No-   weight loss, night sweats, fevers, chills, +fatigue, lassitude. HEENT:   No-  headaches, difficulty swallowing, tooth/dental problems, sore throat,       No-  sneezing, +itching, ear ache, +nasal congestion, post nasal drip,  CV:  No-   chest pain, orthopnea, PND, swelling in lower extremities, anasarca,                                  dizziness, palpitations Resp: No-   shortness of breath with exertion or at rest.              No-   productive cough,  No non-productive cough,  No- coughing up of blood.              No-   change in color of mucus.  No-  wheezing.   Skin: +per HPI GI:  No-   heartburn, indigestion, abdominal pain, nausea, vomiting, GU: . MS:  +joint pain or swelling.  Neuro-     nothing unusual Psych:  No- change in mood or affect. No depression or anxiety.  No memory loss.   Objective:   Physical Exam General- Alert, Oriented, Affect-appropriate, Distress- none acute Skin- rash-none, lesions- none, excoriation- none. +Very dry skin w/ some eczema in ears. Lymphadenopathy- none Head- atraumatic            Eyes- Gross vision intact, PERRLA, conjunctivae-clear            Ears- HOH+            Nose- + Turbinate edema,   no-Septal dev, mucus, polyps, erosion,                                                                     No- Perforation,             Throat- Mallampati III , mucosa clear , drainage- none, tonsils- atrophic Neck- flexible , trachea midline, no stridor , thyroid nl, carotid no bruit Chest - symmetrical excursion , unlabored           Heart/CV- RRR , no murmur , no gallop  , no rub, nl s1 s2                           - JVD- none , edema- none, stasis changes- none, varices- none           Lung- clear to P&A, wheeze- none, cough- none , dullness-none, rub- none           Chest wall- surgical scars Abd-  Br/ Gen/ Rectal- Not done, not indicated Extrem- cyanosis- none, clubbing, none, atrophy- none, strength- nl Neuro- grossly intact to observation

## 2014-06-06 NOTE — Patient Instructions (Signed)
Suggest you talk with Dr Clent RidgesFry about your blood pressure questions  Ok to try taking your allergy shots every other week. If control isn't good enough, we can go easily back to once a week.   Scripts printed to refill Dymista nasal spray, and your antihistamine/decongestant pill to use for allergy if needed.

## 2014-06-07 ENCOUNTER — Ambulatory Visit: Payer: Medicare Other | Admitting: Endocrinology

## 2014-06-09 NOTE — Assessment & Plan Note (Signed)
He has used OTC lubricating drops when needed

## 2014-06-09 NOTE — Assessment & Plan Note (Signed)
We discussed options.  Plan-continue allergy vaccine 1:10 GH but try extending interval to every 2 weeks

## 2014-06-10 ENCOUNTER — Ambulatory Visit: Payer: Medicare Other

## 2014-06-12 ENCOUNTER — Telehealth: Payer: Self-pay | Admitting: Endocrinology

## 2014-06-12 ENCOUNTER — Encounter: Payer: Self-pay | Admitting: Internal Medicine

## 2014-06-12 ENCOUNTER — Ambulatory Visit: Payer: Medicare Other | Admitting: Endocrinology

## 2014-06-12 NOTE — Telephone Encounter (Signed)
Apparently patient had sudden death today, discussed with Sheriff's office who called

## 2014-06-12 NOTE — Telephone Encounter (Signed)
Patient no showed today's appt. Please advise on how to follow up. °A. No follow up necessary. °B. Follow up urgent. Contact patient immediately. °C. Follow up necessary. Contact patient and schedule visit in ___ days. °D. Follow up advised. Contact patient and schedule visit in ____weeks. ° °

## 2014-06-14 DEATH — deceased

## 2014-06-18 ENCOUNTER — Telehealth: Payer: Self-pay | Admitting: Family Medicine

## 2014-06-18 NOTE — Telephone Encounter (Signed)
I put the cause of death to be coronary artery disease but of course we can never know since an autopsy was not performed. With his high cholesterol, HTN, and diabetes he was certainly at risk for this.

## 2014-06-18 NOTE — Telephone Encounter (Signed)
Daughter would like to know what the death certificate stated was cause of death so they could begin to contact the insurance companies.  They do not have a copy of the certificate yet and want to begin to close up loose ends

## 2014-06-20 NOTE — Telephone Encounter (Signed)
I left a voice message advising Cole Morgan to contact funeral home for a copy of death certificate and that will have the information she needs. We cannot disclose this information because pt did not have Cole Morgan on release of information form.

## 2014-06-21 ENCOUNTER — Encounter: Payer: Self-pay | Admitting: Family Medicine

## 2014-10-31 ENCOUNTER — Encounter: Payer: Self-pay | Admitting: Internal Medicine

## 2014-11-07 ENCOUNTER — Telehealth: Payer: Self-pay | Admitting: Internal Medicine

## 2014-11-08 NOTE — Telephone Encounter (Signed)
Pt. Had been coming q wk.. His last shot was 06/03/14. At his last ov: Plan to cont. Allergy v ac. 1:10 but try extending intervals to q 2 wks. Pt. never came back. Please advise.

## 2014-11-11 NOTE — Telephone Encounter (Signed)
Patient expired.

## 2014-11-12 NOTE — Telephone Encounter (Signed)
Sorry to hear that. I apologize for not seeing the death cert.. Usually a red banner shows up on the screen and says: Pt. Expired to warn you before going into the chart. That was not the case this time. Nothing further needed.

## 2014-12-09 ENCOUNTER — Ambulatory Visit: Payer: Medicare Other | Admitting: Internal Medicine

## 2014-12-15 IMAGING — US US ABDOMEN COMPLETE
1 series · 13 of 25 positions shown · non-contrast
Comparison: CT abdomen pelvis of 05/28/2004

CLINICAL DATA: Epigastric pain, the patient is on medication for
hypertension and diabetes

EXAM:
ULTRASOUND ABDOMEN COMPLETE

[Series 1: us abdomen complete · 0.30mm/px · 13 of 72 slices shown]
[im 1/72]
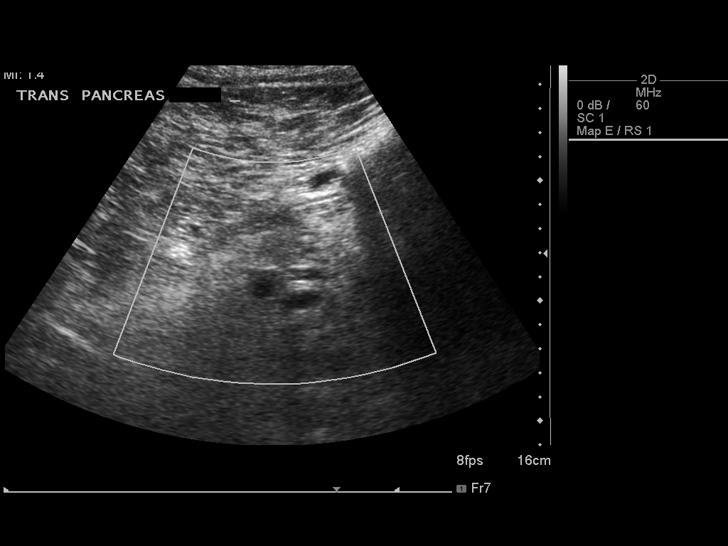
[im 6/72]
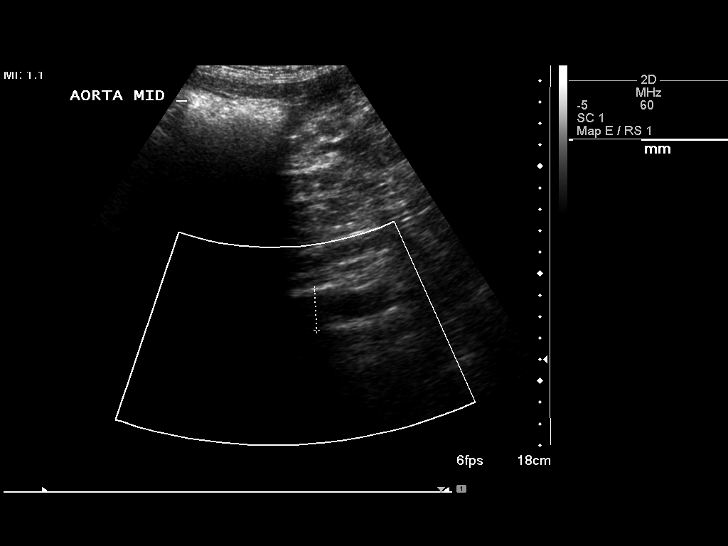
[im 12/72]
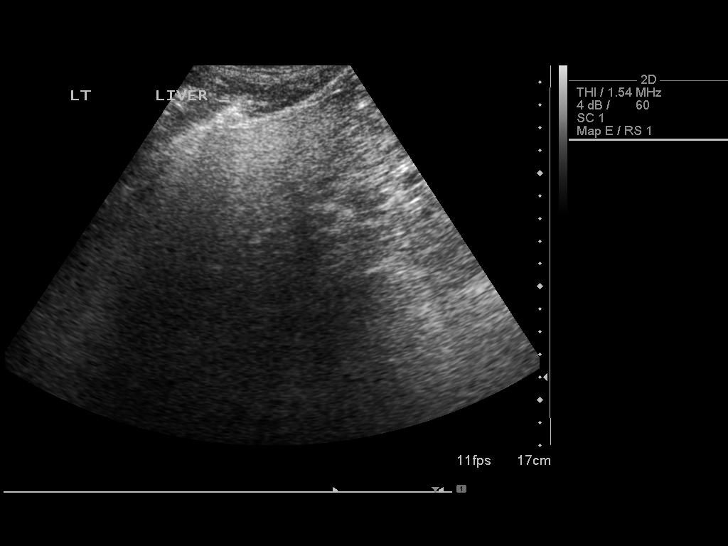
[im 18/72]
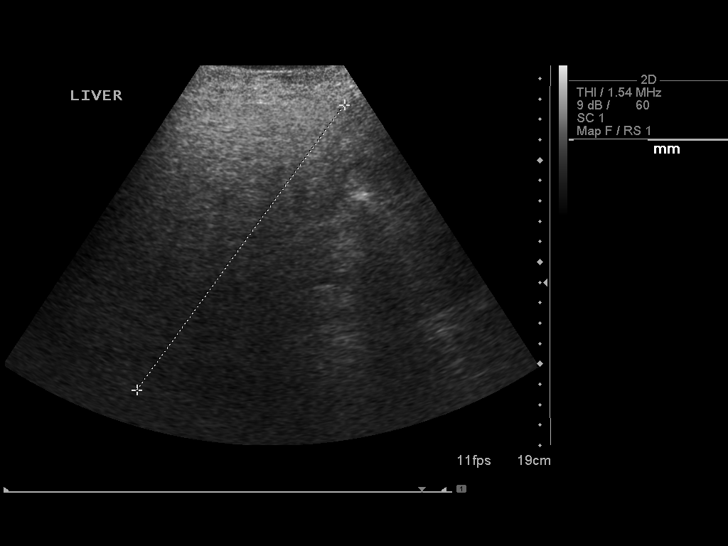
[im 24/72]
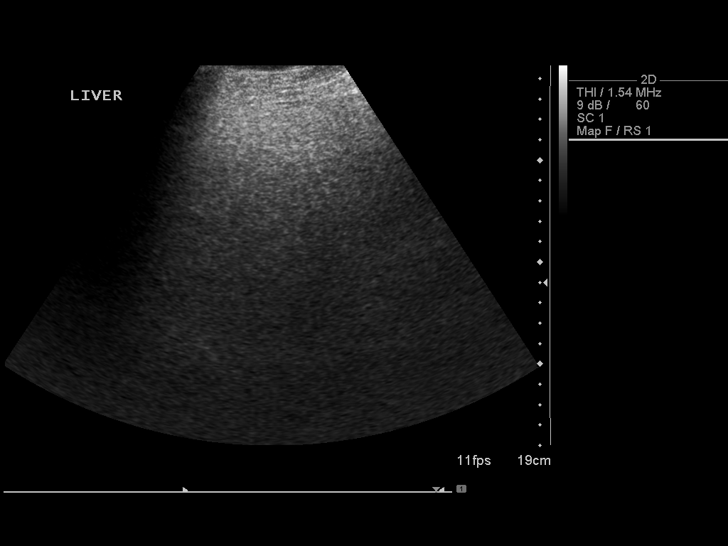
[im 30/72]
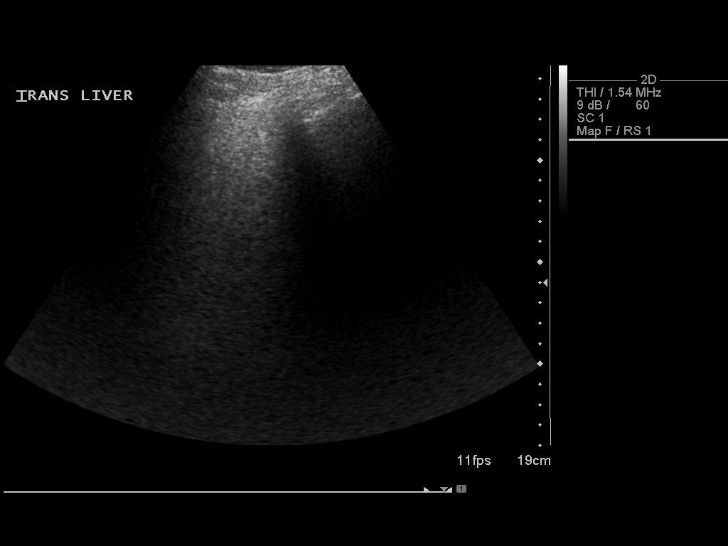
[im 36/72]
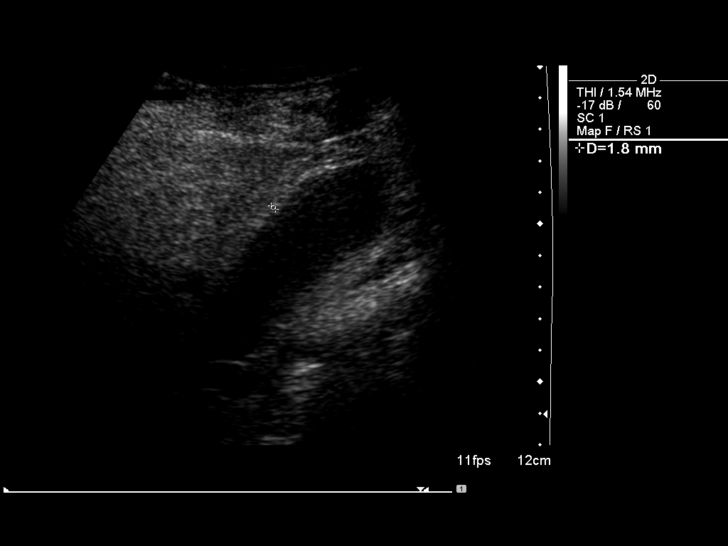
[im 42/72]
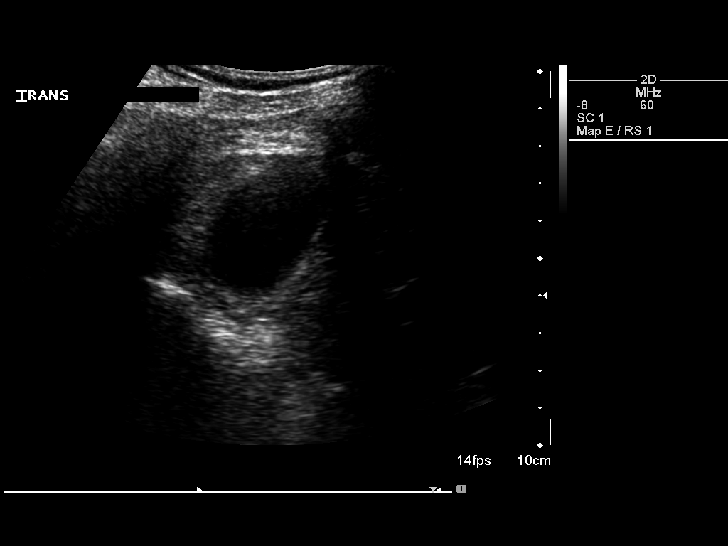
[im 48/72]
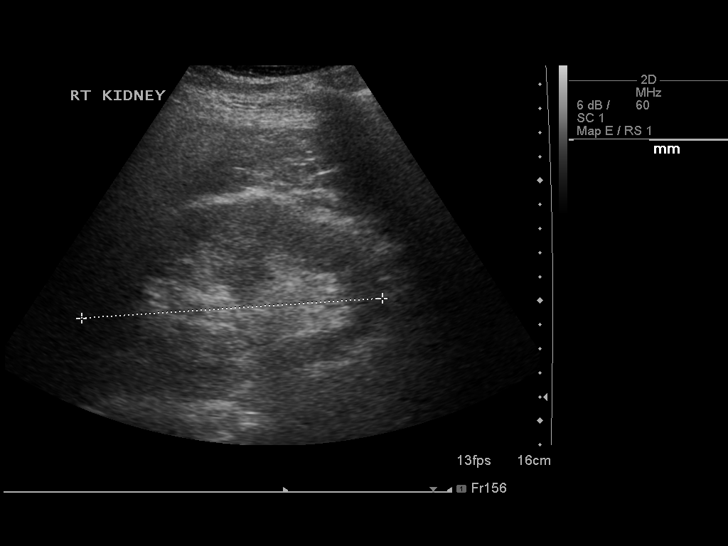
[im 54/72]
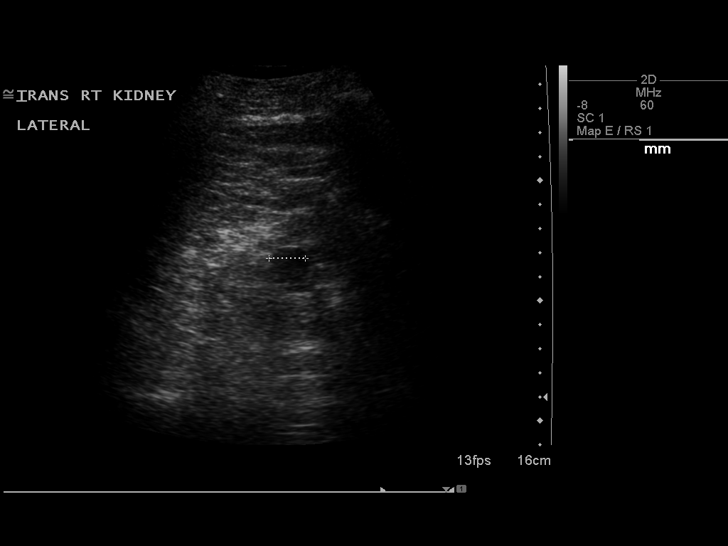
[im 60/72]
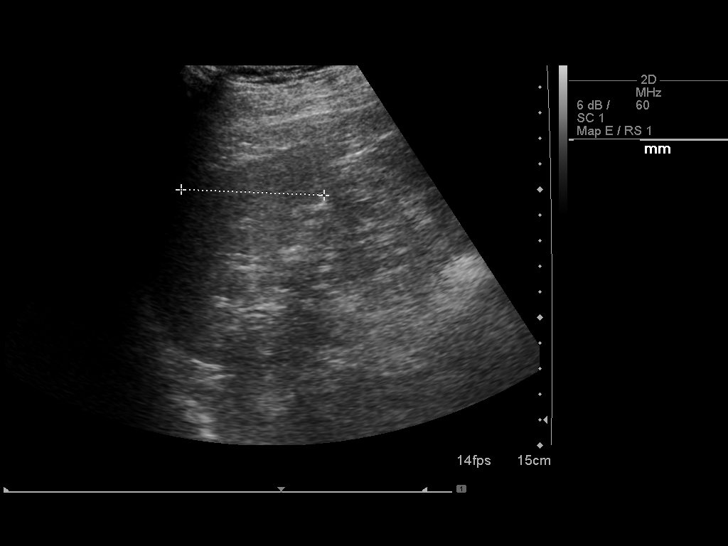
[im 66/72]
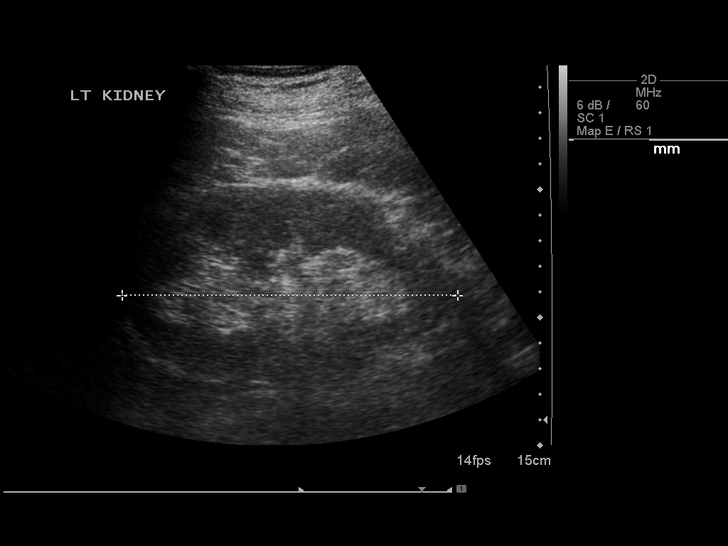
[im 72/72]
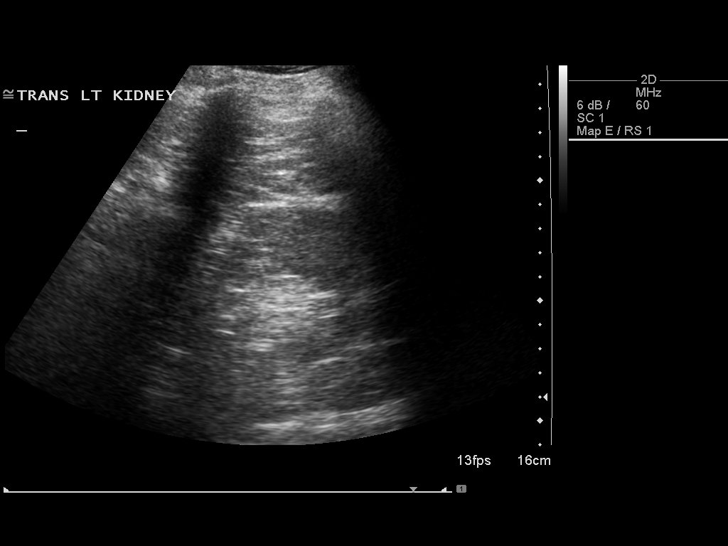

[13 of 25 positions shown; findings below may reference images not displayed]

FINDINGS: Gallbladder:

The gallbladder is visualized and no gallstones are noted. There is
no pain over the gallbladder with compression.

Common bile duct:

Diameter: The common bile duct is normal measuring 2.9 mm in
diameter.

Liver:

The liver is very echogenic and difficult to penetrate, consistent
with diffuse fatty infiltration. No focal abnormality is seen.

IVC:

No abnormality visualized.

Pancreas:

The pancreas is largely obscured by bowel gas.

Spleen:

The spleen is normal measuring 5.6 cm sagittally.

Right Kidney:

Length: 12.6 cm.. Much of the right kidney is obscured by bowel gas.
A small cyst is noted laterally of 2.1 cm.

Left Kidney:

Length: 13.1 cm.. Much of the left kidney also is obscured by bowel
gas.

Abdominal aorta:

The abdominal aorta is obscured by bowel gas.

Other findings:

None.
IMPRESSION: 1. Very echogenic liver consistent with fatty infiltration
diffusely. No focal abnormality.
2. The pancreas, portions of both kidneys, and the abdominal aorta
are obscured by a large amount of bowel gas.
3. No gallstones.
